# Patient Record
Sex: Male | Born: 1970 | Race: White | Hispanic: No | Smoking: Never smoker
Health system: Southern US, Community
[De-identification: ages and names within clinical notes are randomized; demographics above are authoritative.]

## PROBLEM LIST (undated history)

## (undated) DIAGNOSIS — I10 Essential (primary) hypertension: Secondary | ICD-10-CM

## (undated) DIAGNOSIS — J309 Allergic rhinitis, unspecified: Secondary | ICD-10-CM

## (undated) DIAGNOSIS — Q899 Congenital malformation, unspecified: Secondary | ICD-10-CM

## (undated) DIAGNOSIS — G959 Disease of spinal cord, unspecified: Secondary | ICD-10-CM

## (undated) DIAGNOSIS — G4733 Obstructive sleep apnea (adult) (pediatric): Secondary | ICD-10-CM

## (undated) DIAGNOSIS — F909 Attention-deficit hyperactivity disorder, unspecified type: Secondary | ICD-10-CM

## (undated) DIAGNOSIS — U071 COVID-19: Secondary | ICD-10-CM

## (undated) DIAGNOSIS — R569 Unspecified convulsions: Secondary | ICD-10-CM

## (undated) DIAGNOSIS — S069X9A Unspecified intracranial injury with loss of consciousness of unspecified duration, initial encounter: Secondary | ICD-10-CM

## (undated) DIAGNOSIS — J45909 Unspecified asthma, uncomplicated: Secondary | ICD-10-CM

## (undated) DIAGNOSIS — E119 Type 2 diabetes mellitus without complications: Secondary | ICD-10-CM

## (undated) DIAGNOSIS — M719 Bursopathy, unspecified: Secondary | ICD-10-CM

## (undated) DIAGNOSIS — F4310 Post-traumatic stress disorder, unspecified: Secondary | ICD-10-CM

## (undated) DIAGNOSIS — T07XXXA Unspecified multiple injuries, initial encounter: Secondary | ICD-10-CM

## (undated) DIAGNOSIS — H9319 Tinnitus, unspecified ear: Secondary | ICD-10-CM

## (undated) DIAGNOSIS — W3400XA Accidental discharge from unspecified firearms or gun, initial encounter: Secondary | ICD-10-CM

## (undated) DIAGNOSIS — G43909 Migraine, unspecified, not intractable, without status migrainosus: Secondary | ICD-10-CM

## (undated) DIAGNOSIS — N189 Chronic kidney disease, unspecified: Secondary | ICD-10-CM

## (undated) DIAGNOSIS — Q282 Arteriovenous malformation of cerebral vessels: Secondary | ICD-10-CM

## (undated) DIAGNOSIS — K449 Diaphragmatic hernia without obstruction or gangrene: Secondary | ICD-10-CM

## (undated) DIAGNOSIS — S069XAA Unspecified intracranial injury with loss of consciousness status unknown, initial encounter: Secondary | ICD-10-CM

## (undated) HISTORY — DX: Unspecified asthma, uncomplicated: J45.909

## (undated) HISTORY — DX: Unspecified convulsions: R56.9

## (undated) HISTORY — DX: COVID-19: U07.1

## (undated) HISTORY — DX: Chronic kidney disease, unspecified: N18.9

## (undated) HISTORY — DX: Unspecified intracranial injury with loss of consciousness of unspecified duration, initial encounter: S06.9X9A

## (undated) HISTORY — PX: APPENDECTOMY: SHX54

## (undated) HISTORY — DX: Attention-deficit hyperactivity disorder, unspecified type: F90.9

## (undated) HISTORY — PX: BRAIN AVM REPAIR: SHX202

## (undated) HISTORY — DX: Arteriovenous malformation of cerebral vessels: Q28.2

## (undated) HISTORY — DX: Post-traumatic stress disorder, unspecified: F43.10

## (undated) HISTORY — PX: ACHILLES TENDON SURGERY: SHX542

## (undated) HISTORY — DX: Migraine, unspecified, not intractable, without status migrainosus: G43.909

## (undated) HISTORY — PX: OTHER SURGICAL HISTORY: SHX169

## (undated) HISTORY — DX: Allergic rhinitis, unspecified: J30.9

## (undated) HISTORY — DX: Disease of spinal cord, unspecified: G95.9

## (undated) HISTORY — DX: Obstructive sleep apnea (adult) (pediatric): G47.33

## (undated) HISTORY — DX: Unspecified multiple injuries, initial encounter: T07.XXXA

## (undated) HISTORY — DX: Diaphragmatic hernia without obstruction or gangrene: K44.9

## (undated) HISTORY — DX: Tinnitus, unspecified ear: H93.19

## (undated) HISTORY — DX: Unspecified intracranial injury with loss of consciousness status unknown, initial encounter: S06.9XAA

## (undated) HISTORY — DX: Bursopathy, unspecified: M71.9

## (undated) HISTORY — DX: Congenital malformation, unspecified: Q89.9

---

## 2011-09-25 ENCOUNTER — Emergency Department: Payer: Self-pay | Admitting: Emergency Medicine

## 2011-09-25 LAB — CBC
HCT: 43.9 % (ref 40.0–52.0)
MCH: 27.5 pg (ref 26.0–34.0)
MCV: 81 fL (ref 80–100)
Platelet: 242 10*3/uL (ref 150–440)
RBC: 5.43 10*6/uL (ref 4.40–5.90)
RDW: 13.8 % (ref 11.5–14.5)

## 2011-09-25 LAB — COMPREHENSIVE METABOLIC PANEL
Alkaline Phosphatase: 71 U/L (ref 50–136)
Calcium, Total: 8.9 mg/dL (ref 8.5–10.1)
Co2: 28 mmol/L (ref 21–32)
Creatinine: 0.98 mg/dL (ref 0.60–1.30)
EGFR (Non-African Amer.): >60
SGOT(AST): 52 U/L — ABNORMAL HIGH (ref 15–37)
SGPT (ALT): 82 U/L — ABNORMAL HIGH (ref 12–78)

## 2011-09-25 LAB — ETHANOL
Ethanol %: 0.038 % (ref 0.000–0.080)
Ethanol: 38 mg/dL

## 2011-09-25 LAB — TSH: Thyroid Stimulating Horm: 1.76 u[IU]/mL

## 2011-09-26 LAB — BASIC METABOLIC PANEL
Anion Gap: 7 (ref 7–16)
BUN: 17 mg/dL (ref 7–18)
Calcium, Total: 8.9 mg/dL (ref 8.5–10.1)
Chloride: 107 mmol/L (ref 98–107)
EGFR (Non-African Amer.): >60
Glucose: 103 mg/dL — ABNORMAL HIGH (ref 65–99)
Osmolality: 283 (ref 275–301)
Potassium: 3.9 mmol/L (ref 3.5–5.1)

## 2011-09-26 LAB — CBC
HCT: 43.3 % (ref 40.0–52.0)
MCHC: 33.8 g/dL (ref 32.0–36.0)
Platelet: 233 10*3/uL (ref 150–440)
RBC: 5.37 10*6/uL (ref 4.40–5.90)
WBC: 12.1 10*3/uL — ABNORMAL HIGH (ref 3.8–10.6)

## 2011-09-26 LAB — URINALYSIS, COMPLETE
Bilirubin,UR: NEGATIVE
Nitrite: NEGATIVE
RBC,UR: <1 /HPF (ref 0–5)
Squamous Epithelial: NONE SEEN
WBC UR: <1 /HPF (ref 0–5)

## 2011-09-26 LAB — DRUG SCREEN, URINE
Barbiturates, Ur Screen: NEGATIVE (ref ?–200)
Cannabinoid 50 Ng, Ur ~~LOC~~: NEGATIVE (ref ?–50)
Cocaine Metabolite,Ur ~~LOC~~: NEGATIVE (ref ?–300)
MDMA (Ecstasy)Ur Screen: NEGATIVE (ref ?–500)
Opiate, Ur Screen: POSITIVE (ref ?–300)
Tricyclic, Ur Screen: NEGATIVE (ref ?–1000)

## 2011-09-26 LAB — APTT: Activated PTT: 28.6 secs (ref 23.6–35.9)

## 2013-02-16 ENCOUNTER — Emergency Department: Payer: Self-pay | Admitting: Emergency Medicine

## 2013-02-16 LAB — CBC WITH DIFFERENTIAL/PLATELET
BASOS PCT: 0.8 %
Basophil #: 0.1 10*3/uL (ref 0.0–0.1)
EOS ABS: 0.4 10*3/uL (ref 0.0–0.7)
EOS PCT: 5.1 %
HCT: 45.5 % (ref 40.0–52.0)
HGB: 15.6 g/dL (ref 13.0–18.0)
LYMPHS ABS: 1.2 10*3/uL (ref 1.0–3.6)
LYMPHS PCT: 13.9 %
MCH: 27.7 pg (ref 26.0–34.0)
MCHC: 34.2 g/dL (ref 32.0–36.0)
MCV: 81 fL (ref 80–100)
Monocyte #: 1 x10 3/mm (ref 0.2–1.0)
Monocyte %: 11.7 %
Neutrophil #: 5.7 10*3/uL (ref 1.4–6.5)
Neutrophil %: 68.5 %
Platelet: 190 10*3/uL (ref 150–440)
RBC: 5.62 10*6/uL (ref 4.40–5.90)
RDW: 14.2 % (ref 11.5–14.5)
WBC: 8.4 10*3/uL (ref 3.8–10.6)

## 2013-02-16 LAB — URINALYSIS, COMPLETE
BACTERIA: NONE SEEN
BLOOD: NEGATIVE
Bilirubin,UR: NEGATIVE
Glucose,UR: NEGATIVE mg/dL (ref 0–75)
KETONE: NEGATIVE
LEUKOCYTE ESTERASE: NEGATIVE
Nitrite: NEGATIVE
Ph: 6 (ref 4.5–8.0)
Protein: NEGATIVE
RBC,UR: NONE SEEN /HPF (ref 0–5)
Specific Gravity: 1.005 (ref 1.003–1.030)
Squamous Epithelial: NONE SEEN

## 2013-02-16 LAB — COMPREHENSIVE METABOLIC PANEL
Albumin: 3.7 g/dL (ref 3.4–5.0)
Alkaline Phosphatase: 75 U/L
Anion Gap: 6 — ABNORMAL LOW (ref 7–16)
BUN: 6 mg/dL — ABNORMAL LOW (ref 7–18)
Bilirubin,Total: 0.5 mg/dL (ref 0.2–1.0)
CALCIUM: 8.4 mg/dL — AB (ref 8.5–10.1)
Chloride: 101 mmol/L (ref 98–107)
Co2: 26 mmol/L (ref 21–32)
Creatinine: 0.96 mg/dL (ref 0.60–1.30)
EGFR (Non-African Amer.): >60
GLUCOSE: 140 mg/dL — AB (ref 65–99)
Osmolality: 266 (ref 275–301)
Potassium: 3.9 mmol/L (ref 3.5–5.1)
SGOT(AST): 87 U/L — ABNORMAL HIGH (ref 15–37)
SGPT (ALT): 140 U/L — ABNORMAL HIGH (ref 12–78)
SODIUM: 133 mmol/L — AB (ref 136–145)
TOTAL PROTEIN: 7.5 g/dL (ref 6.4–8.2)

## 2013-02-16 LAB — LIPASE, BLOOD: Lipase: 94 U/L (ref 73–393)

## 2014-04-07 ENCOUNTER — Emergency Department: Payer: Self-pay | Admitting: Physician Assistant
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
## 2014-09-07 ENCOUNTER — Emergency Department
Admission: EM | Admit: 2014-09-07 | Discharge: 2014-09-07 | Disposition: A | Discharge status: Home / Self Care | Payer: Medicare Other | Attending: Emergency Medicine | Admitting: Emergency Medicine

## 2014-09-07 ENCOUNTER — Emergency Department: Payer: Medicare Other

## 2014-09-07 ENCOUNTER — Encounter: Payer: Self-pay | Admitting: Emergency Medicine

## 2014-09-07 ENCOUNTER — Other Ambulatory Visit: Payer: Self-pay

## 2014-09-07 DIAGNOSIS — R059 Cough, unspecified: Secondary | ICD-10-CM

## 2014-09-07 DIAGNOSIS — R05 Cough: Secondary | ICD-10-CM

## 2014-09-07 DIAGNOSIS — B349 Viral infection, unspecified: Secondary | ICD-10-CM | POA: Diagnosis not present

## 2014-09-07 DIAGNOSIS — N23 Unspecified renal colic: Secondary | ICD-10-CM | POA: Insufficient documentation

## 2014-09-07 DIAGNOSIS — R109 Unspecified abdominal pain: Secondary | ICD-10-CM

## 2014-09-07 DIAGNOSIS — E119 Type 2 diabetes mellitus without complications: Secondary | ICD-10-CM | POA: Insufficient documentation

## 2014-09-07 DIAGNOSIS — R1011 Right upper quadrant pain: Secondary | ICD-10-CM | POA: Diagnosis present

## 2014-09-07 HISTORY — DX: Type 2 diabetes mellitus without complications: E11.9

## 2014-09-07 LAB — CBC
HCT: 45.6 % (ref 40.0–52.0)
Hemoglobin: 15.6 g/dL (ref 13.0–18.0)
MCH: 28.3 pg (ref 26.0–34.0)
MCHC: 34.2 g/dL (ref 32.0–36.0)
MCV: 82.9 fL (ref 80.0–100.0)
Platelets: 211 10*3/uL (ref 150–440)
RBC: 5.51 MIL/uL (ref 4.40–5.90)
RDW: 13.5 % (ref 11.5–14.5)
WBC: 6.5 10*3/uL (ref 3.8–10.6)

## 2014-09-07 LAB — HEPATIC FUNCTION PANEL
ALK PHOS: 63 U/L (ref 38–126)
ALT: 81 U/L — ABNORMAL HIGH (ref 17–63)
AST: 93 U/L — ABNORMAL HIGH (ref 15–41)
Albumin: 3.9 g/dL (ref 3.5–5.0)
Bilirubin, Direct: 0.1 mg/dL (ref 0.1–0.5)
Indirect Bilirubin: 0.5 mg/dL (ref 0.3–0.9)
Total Bilirubin: 0.6 mg/dL (ref 0.3–1.2)
Total Protein: 7.2 g/dL (ref 6.5–8.1)

## 2014-09-07 LAB — URINALYSIS COMPLETE WITH MICROSCOPIC (ARMC ONLY)
BACTERIA UA: NONE SEEN
Bilirubin Urine: NEGATIVE
GLUCOSE, UA: 150 mg/dL — AB
Hgb urine dipstick: NEGATIVE
KETONES UR: NEGATIVE mg/dL
Leukocytes, UA: NEGATIVE
Nitrite: NEGATIVE
Protein, ur: NEGATIVE mg/dL
SPECIFIC GRAVITY, URINE: 1.011 (ref 1.005–1.030)
Squamous Epithelial / LPF: NONE SEEN
pH: 7 (ref 5.0–8.0)

## 2014-09-07 LAB — LIPASE, BLOOD: Lipase: 16 U/L — ABNORMAL LOW (ref 22–51)

## 2014-09-07 LAB — BASIC METABOLIC PANEL
Anion gap: 7 (ref 5–15)
BUN: 8 mg/dL (ref 6–20)
CO2: 26 mmol/L (ref 22–32)
CREATININE: 0.94 mg/dL (ref 0.61–1.24)
Calcium: 8.7 mg/dL — ABNORMAL LOW (ref 8.9–10.3)
Chloride: 100 mmol/L — ABNORMAL LOW (ref 101–111)
GFR calc Af Amer: >60 mL/min (ref >60)
GFR calc non Af Amer: >60 mL/min (ref >60)
Glucose, Bld: 274 mg/dL — ABNORMAL HIGH (ref 65–99)
Potassium: 4.2 mmol/L (ref 3.5–5.1)
Sodium: 133 mmol/L — ABNORMAL LOW (ref 135–145)

## 2014-09-07 LAB — TROPONIN I

## 2014-09-07 MED ORDER — OXYCODONE-ACETAMINOPHEN 5-325 MG PO TABS: 1.0000 | ORAL_TABLET | Freq: Four times a day (QID) | ORAL | Status: DC | PRN

## 2014-09-07 MED ORDER — HYDROMORPHONE HCL 1 MG/ML IJ SOLN
1.0000 mg | Freq: Once | INTRAMUSCULAR | Status: AC
  Administered 2014-09-07: 1 mg via INTRAVENOUS
  Filled 2014-09-07: qty 1

## 2014-09-07 MED ORDER — ONDANSETRON HCL 4 MG/2ML IJ SOLN
4.0000 mg | Freq: Once | INTRAMUSCULAR | Status: AC
  Administered 2014-09-07: 4 mg via INTRAVENOUS
  Filled 2014-09-07: qty 2

## 2014-09-07 MED ORDER — SODIUM CHLORIDE 0.9 % IV SOLN
INTRAVENOUS | Status: DC
  Administered 2014-09-07: 17:00:00 via INTRAVENOUS

## 2014-09-07 NOTE — ED Notes (Addendum)
Pt appears to be in severe pain. Pt states after coughing he heard a popping sound before pain. Pain to chest and middle of back that is increased with breathing. Bilateral lung sounds. Wheezing noted to left lung and rhonchi to right. Cold symptoms since Monday. Pt states he is coughing up brown mucus. Currently taking Amoxillin  for swimmer ear infection.

## 2014-09-07 NOTE — ED Notes (Signed)
Reports recent URI and ear infection.  Currently taking amoxicillin.  States coughing this morning and felt a "pop" in front chest, lower ribs.  States has vomited twice since being at the ED.

## 2014-09-07 NOTE — ED Provider Notes (Addendum)
CSN: [id number]     Arrival date & time 09/07/14  1227 History   First MD Initiated Contact with Patient 09/07/14 1615     Chief Complaint  Patient presents with  . Chest Pain  . Cough     (Consider location/radiation/quality/duration/timing/severity/associated sxs/prior Treatment) HPI  44 year old male presents to the emergency department for evaluation of cough, congestion, right upper quadrant abdominal pain, and right flank pain. Patient states he's had cough and congestion with sinus drainage for the last week. Coughing increased over the last 2 days. This morning upon awakening patient had phlegm caught in his throat, he had several deeper part coughs and felt a pop in his right last rib. He states he's had sharp pain in his right upper quadrant of the abdomen. He points to his right upper quadrant as location of pain. He states this will radiate to his back and will sometimes radiate down into his pelvic area. He has a history of kidney stones. He denies any urinary symptoms. He denies any chest pain or shortness of breath. Patient was recently treated 2 days ago with anabiotic's for right ear otitis media. He denies any nausea vomiting. He has a history of appendectomy.  Past Medical History  Diagnosis Date  . Diabetes mellitus without complication    Past Surgical History  Procedure Laterality Date  . Right ankle surgery     No family history on file. History  Substance Use Topics  . Smoking status: Never Smoker   . Smokeless tobacco: Never Used  . Alcohol Use: No    Review of Systems  Constitutional: Negative.  Negative for fever, chills, activity change and appetite change.  HENT: Positive for congestion. Negative for ear pain, mouth sores, rhinorrhea, sinus pressure, sore throat and trouble swallowing.   Eyes: Negative for photophobia, pain and discharge.  Respiratory: Negative for cough, chest tightness and shortness of breath.   Cardiovascular: Negative for chest pain  and leg swelling.  Gastrointestinal: Positive for abdominal pain. Negative for nausea, vomiting, diarrhea and abdominal distention.  Genitourinary: Positive for flank pain. Negative for dysuria and difficulty urinating.  Musculoskeletal: Negative for back pain, arthralgias and gait problem.  Skin: Negative for color change and rash.  Neurological: Negative for dizziness and headaches.  Hematological: Negative for adenopathy.  Psychiatric/Behavioral: Negative for behavioral problems and agitation.      Allergies  Tramadol  Home Medications   Prior to Admission medications   Medication Sig Start Date End Date Taking? Authorizing Provider  oxyCODONE-acetaminophen (ROXICET) 5-325 MG per tablet Take 1-2 tablets by mouth every 6 (six) hours as needed for severe pain. 09/07/14   Evon Slack, PA-C   BP 128/81 mmHg  Pulse 92  Temp(Src) 98.2 F (36.8 C) (Oral)  Resp 18  Ht 5\' 10"  (1.778 m)  Wt 248 lb (112.492 kg)  BMI 35.58 kg/m2  SpO2 100% Physical Exam  Constitutional: He is oriented to person, place, and time. He appears well-developed and well-nourished.  HENT:  Head: Normocephalic and atraumatic.  Eyes: Conjunctivae and EOM are normal. Pupils are equal, round, and reactive to light.  Neck: Normal range of motion. Neck supple.  Cardiovascular: Normal rate, regular rhythm, normal heart sounds and intact distal pulses.  Exam reveals no friction rub.   Pulmonary/Chest: Effort normal and breath sounds normal. No respiratory distress. He has no wheezes. He has no rales. He exhibits no tenderness.  Abdominal: Soft. Bowel sounds are normal. He exhibits no distension and no mass. There is tenderness (  right upper quadrant, positive Murphy's sign. Positive tenderness to percussion with CVA percussion). There is no rebound and no guarding.  Musculoskeletal: Normal range of motion. He exhibits no edema or tenderness.  Neurological: He is alert and oriented to person, place, and time.  Skin:  Skin is warm and dry.  Psychiatric: He has a normal mood and affect. His behavior is normal. Judgment and thought content normal.    ED Course  Procedures (including critical care time) Labs Review Labs Reviewed  BASIC METABOLIC PANEL - Abnormal; Notable for the following:    Sodium 133 (*)    Chloride 100 (*)    Glucose, Bld 274 (*)    Calcium 8.7 (*)    All other components within normal limits  LIPASE, BLOOD - Abnormal; Notable for the following:    Lipase 16 (*)    All other components within normal limits  HEPATIC FUNCTION PANEL - Abnormal; Notable for the following:    AST 93 (*)    ALT 81 (*)    All other components within normal limits  URINALYSIS COMPLETEWITH MICROSCOPIC ([facility] ONLY) - Abnormal; Notable for the following:    Color, Urine YELLOW (*)    APPearance CLEAR (*)    Glucose, UA 150 (*)    All other components within normal limits  CBC  TROPONIN I    Imaging Review Ct Abdomen Pelvis Wo Contrast  09/07/2014   CLINICAL DATA:  Right upper quadrant abdominal/ right flank pain for 12 hr after coughing. Cough for 1 week. Nausea and vomiting.  EXAM: CT ABDOMEN AND PELVIS WITHOUT CONTRAST  TECHNIQUE: Multidetector CT imaging of the abdomen and pelvis was performed following the standard protocol without IV contrast.  COMPARISON:  02/16/2013  FINDINGS: There is at most minimal atelectasis in the lung bases.  Diffusely decreased attenuation of the liver is again noted and is consistent with steatosis with mild fatty sparing in the gallbladder fossa. The gallbladder, spleen, adrenal glands, and pancreas have an unremarkable unenhanced appearance. There is a 2 mm nonobstructing calculus in the interpolar left kidney, unchanged. No right renal calculi or hydronephrosis is seen. No ureteral calculi or ureteral dilatation is identified.  There is no evidence of bowel obstruction. Appendix is not visualized consistent with prior appendectomy. No bowel wall thickening is seen.  There  is moderate distention of the bladder which is otherwise unremarkable. No free fluid or enlarged lymph nodes are identified. No acute osseous abnormality is seen.  IMPRESSION: 1. Punctate, nonobstructing left renal calculus. No right-sided urinary tract calculi identified. 2. Hepatic steatosis.   Electronically Signed   By: Sebastian Ache   On: 09/07/2014 16:58   Dg Chest 2 View  09/07/2014   CLINICAL DATA:  Right-sided chest pain beginning 09/06/2014. Initial encounter.  EXAM: CHEST  2 VIEW  COMPARISON:  PA and lateral chest 04/07/2014.  FINDINGS: Lungs are clear. Heart size is normal. No pneumothorax or pleural effusion. No focal bony abnormality.  IMPRESSION: Negative chest.   Electronically Signed   By: Drusilla Kanner M.D.   On: 09/07/2014 13:44    EKG  Date: 09/07/2014  Rate: 77  Rhythm: normal sinus rhythm  QRS Axis: normal  Intervals: normal  ST/T Wave abnormalities: normal  Conduction Disutrbances: none      MDM   Final diagnoses:  Right flank pain  Renal colic on right side  Cough  Viral illness    44 year old male with right upper quadrant and right flank pain that began earlier this  morning after coughing. Patient denies any chest pain or shortness of breath. Chest x-ray was normal. CT of the abdomen and pelvis was normal except for renal calculi. Labs/area were all normal. Patient and I discussed renal colic, muscular strain. Patient was offered ultrasound to better visualize the gallbladder but they did he would not like to proceed with this imaging today. We also discussed red flags to return to the ER for. Patient was given Percocet for pain. He'll return to the ER immediately for any worsening symptoms or urgent changes in his health.    Evon Slack, PA-C 09/07/14 2005  Jene Every, MD 09/07/14 2032  Evon Slack, PA-C 09/07/14 2356  Jene Every, MD 09/09/14 1340

## 2014-09-07 NOTE — ED Notes (Signed)
Pt alert and oriented X4, active, cooperative, pt in NAD. RR even and unlabored, color WNL.  Pt informed to return if any life threatening symptoms occur.  Pt leaving with cousin who is driving him home.

## 2014-09-07 NOTE — ED Notes (Signed)
Consult ERMD for results review. Pt moved to flex wait.

## 2014-09-07 NOTE — Discharge Instructions (Signed)
Abdominal Pain Many things can cause abdominal pain. Usually, abdominal pain is not caused by a disease and will improve without treatment. It can often be observed and treated at home. Your health care provider will do a physical exam and possibly order blood tests and X-rays to help determine the seriousness of your pain. However, in many cases, more time must pass before a clear cause of the pain can be found. Before that point, your health care provider may not know if you need more testing or further treatment. HOME CARE INSTRUCTIONS  Monitor your abdominal pain for any changes. The following actions may help to alleviate any discomfort you are experiencing:  Only take over-the-counter or prescription medicines as directed by your health care provider.  Do not take laxatives unless directed to do so by your health care provider.  Try a clear liquid diet (broth, tea, or water) as directed by your health care provider. Slowly move to a bland diet as tolerated. SEEK MEDICAL CARE IF:  You have unexplained abdominal pain.  You have abdominal pain associated with nausea or diarrhea.  You have pain when you urinate or have a bowel movement.  You experience abdominal pain that wakes you in the night.  You have abdominal pain that is worsened or improved by eating food.  You have abdominal pain that is worsened with eating fatty foods.  You have a fever. SEEK IMMEDIATE MEDICAL CARE IF:   Your pain does not go away within 2 hours.  You keep throwing up (vomiting).  Your pain is felt only in portions of the abdomen, such as the right side or the left lower portion of the abdomen.  You pass bloody or black tarry stools. MAKE SURE YOU:  Understand these instructions.   Will watch your condition.   Will get help right away if you are not doing well or get worse.  Document Released: 11/11/2004 Document Revised: 02/06/2013 Document Reviewed: 10/11/2012 [facility] Patient Information  2015 [place], [place]. This information is not intended to replace advice given to you by your health care provider. Make sure you discuss any questions you have with your health care provider.   Cough, Adult  A cough is a reflex. It helps you clear your throat and airways. A cough can help heal your body. A cough can last 2 or 3 weeks (acute) or may last more than 8 weeks (chronic). Some common causes of a cough can include an infection, allergy, or a cold. HOME CARE  Only take medicine as told by your doctor.  If given, take your medicines (antibiotics) as told. Finish them even if you start to feel better.  Use a cold steam vaporizer or humidifier in your home. This can help loosen thick spit (secretions).  Sleep so you are almost sitting up (semi-upright). Use pillows to do this. This helps reduce coughing.  Rest as needed.  Stop smoking if you smoke. GET HELP RIGHT AWAY IF:  You have yellowish-white fluid (pus) in your thick spit.  Your cough gets worse.  Your medicine does not reduce coughing, and you are losing sleep.  You cough up blood.  You have trouble breathing.  Your pain gets worse and medicine does not help.  You have a fever. MAKE SURE YOU:   Understand these instructions.  Will watch your condition.  Will get help right away if you are not doing well or get worse. Document Released: 10/15/2010 Document Revised: 06/18/2013 Document Reviewed: 10/15/2010 [employer] Patient Information 2015 [employer]. This information is not intended to replace advice given to you by your health care provider. Make sure you discuss any questions you have with your health care provider.  Flank Pain Flank pain is pain in your side. The flank is the area of your side between your upper belly (abdomen) and your back. Pain in this area can be caused by many different things. HOME CARE Home care and treatment will depend on the cause of your pain.  Rest as told by your  doctor.  Drink enough fluids to keep your pee (urine) clear or pale yellow.  Only take medicine as told by your doctor.  Tell your doctor about any changes in your pain.  Follow up with your doctor. GET HELP RIGHT AWAY IF:   Your pain does not get better with medicine.   You have new symptoms or your symptoms get worse.  Your pain gets worse.   You have belly (abdominal) pain.   You are short of breath.   You always feel sick to your stomach (nauseous).   You keep throwing up (vomiting).   You have puffiness (swelling) in your belly.   You feel light-headed or you pass out (faint).   You have blood in your pee.  You have a fever or lasting symptoms for more than 2-3 days.  You have a fever and your symptoms suddenly get worse. MAKE SURE YOU:   Understand these instructions.  Will watch your condition.  Will get help right away if you are not doing well or get worse. Document Released: 11/11/2007 Document Revised: 06/18/2013 Document Reviewed: 09/16/2011 [facility] Patient Information 2015 [place], [place]. This information is not intended to replace advice given to you by your health care provider. Make sure you discuss any questions you have with your health care provider.

## 2014-09-08 LAB — HM HEPATITIS C SCREENING LAB: HM Hepatitis Screen: NEGATIVE

## 2015-02-07 ENCOUNTER — Emergency Department: Payer: Medicare Other

## 2015-02-07 ENCOUNTER — Encounter: Payer: Self-pay | Admitting: Emergency Medicine

## 2015-02-07 ENCOUNTER — Emergency Department
Admission: EM | Admit: 2015-02-07 | Discharge: 2015-02-07 | Disposition: A | Discharge status: Home / Self Care | Payer: Medicare Other | Attending: Emergency Medicine | Admitting: Emergency Medicine

## 2015-02-07 DIAGNOSIS — G8929 Other chronic pain: Secondary | ICD-10-CM | POA: Insufficient documentation

## 2015-02-07 DIAGNOSIS — E119 Type 2 diabetes mellitus without complications: Secondary | ICD-10-CM | POA: Insufficient documentation

## 2015-02-07 DIAGNOSIS — I10 Essential (primary) hypertension: Secondary | ICD-10-CM | POA: Diagnosis not present

## 2015-02-07 DIAGNOSIS — R0789 Other chest pain: Secondary | ICD-10-CM

## 2015-02-07 DIAGNOSIS — R42 Dizziness and giddiness: Secondary | ICD-10-CM | POA: Insufficient documentation

## 2015-02-07 HISTORY — DX: Accidental discharge from unspecified firearms or gun, initial encounter: W34.00XA

## 2015-02-07 HISTORY — DX: Essential (primary) hypertension: I10

## 2015-02-07 HISTORY — DX: Post-traumatic stress disorder, unspecified: F43.10

## 2015-02-07 LAB — COMPREHENSIVE METABOLIC PANEL
ALT: 96 U/L — ABNORMAL HIGH (ref 17–63)
Albumin: 4.4 g/dL (ref 3.5–5.0)
Alkaline Phosphatase: 66 U/L (ref 38–126)
Anion gap: 10 (ref 5–15)
BUN: 11 mg/dL (ref 6–20)
CHLORIDE: 99 mmol/L — AB (ref 101–111)
CO2: 25 mmol/L (ref 22–32)
Calcium: 9.2 mg/dL (ref 8.9–10.3)
Creatinine, Ser: 0.93 mg/dL (ref 0.61–1.24)
GFR calc Af Amer: >60 mL/min (ref >60)
GFR calc non Af Amer: >60 mL/min (ref >60)
Glucose, Bld: 142 mg/dL — ABNORMAL HIGH (ref 65–99)
SODIUM: 134 mmol/L — AB (ref 135–145)
Total Protein: 7.5 g/dL (ref 6.5–8.1)

## 2015-02-07 LAB — CBC WITH DIFFERENTIAL/PLATELET
BASOS ABS: 0 10*3/uL (ref 0–0.1)
Basophils Relative: 1 %
EOS ABS: 0.2 10*3/uL (ref 0–0.7)
EOS PCT: 2 %
HCT: 46 % (ref 40.0–52.0)
Hemoglobin: 15.8 g/dL (ref 13.0–18.0)
LYMPHS ABS: 1.3 10*3/uL (ref 1.0–3.6)
LYMPHS PCT: 17 %
MCH: 28.5 pg (ref 26.0–34.0)
MCHC: 34.3 g/dL (ref 32.0–36.0)
MCV: 83.3 fL (ref 80.0–100.0)
MONO ABS: 0.7 10*3/uL (ref 0.2–1.0)
Monocytes Relative: 9 %
Neutro Abs: 5.8 10*3/uL (ref 1.4–6.5)
Neutrophils Relative %: 71 %
PLATELETS: 195 10*3/uL (ref 150–440)
RBC: 5.53 MIL/uL (ref 4.40–5.90)
RDW: 13.3 % (ref 11.5–14.5)
WBC: 8.1 10*3/uL (ref 3.8–10.6)

## 2015-02-07 LAB — ETHANOL

## 2015-02-07 LAB — TROPONIN I: Troponin I: 0.04 ng/mL — ABNORMAL HIGH (ref <0.031)

## 2015-02-07 MED ORDER — OXYCODONE-ACETAMINOPHEN 5-325 MG PO TABS
1.0000 | ORAL_TABLET | Freq: Once | ORAL | Status: AC
  Administered 2015-02-07: 1 via ORAL
  Filled 2015-02-07: qty 1

## 2015-02-07 MED ORDER — OXYCODONE-ACETAMINOPHEN 5-325 MG PO TABS: 1.0000 | ORAL_TABLET | Freq: Four times a day (QID) | ORAL | Status: DC | PRN

## 2015-02-07 MED ORDER — FENTANYL CITRATE (PF) 100 MCG/2ML IJ SOLN
50.0000 ug | Freq: Once | INTRAMUSCULAR | Status: AC
  Administered 2015-02-07: 50 ug via INTRAVENOUS
  Filled 2015-02-07: qty 2

## 2015-02-07 NOTE — ED Notes (Signed)
Patient transported to X-ray 

## 2015-02-07 NOTE — Discharge Instructions (Signed)
We are reassured by your exam.  As I explained to you, without further evaluation we cannot definitively tell you that every thing is ok with your heart.  We have low suspicion, however, that your tests would be positive.  Nonetheless, we have offered and advised that you stay for the next few hours to have another blood test done. You have declined this, which is certainly your choice, but it does limit our ability to rule out some more serious problems.  For this reason, we strongly advise return to the emergency room for any new or worrisome symptoms including increased pain in her chest, shortness of breath, change in her chest pain or if you change your mind about further evaluation. It is certainly too given her chest wall may advise follow-up with orthopedic surgery as an outpatient as you could have a torn muscle. In addition, we do routinely recommend that patient's with chest pain follow-up with cardiology and we recommend this for you as well.  Chest Wall Pain Chest wall pain is pain in or around the bones and muscles of your chest. Sometimes, an injury causes this pain. Sometimes, the cause may not be known. This pain may take several weeks or longer to get better. HOME CARE INSTRUCTIONS  Pay attention to any changes in your symptoms. Take these actions to help with your pain:   Rest as told by your health care provider.   Avoid activities that cause pain. These include any activities that use your chest muscles or your abdominal and side muscles to lift heavy items.   If directed, apply ice to the painful area:  Put ice in a plastic bag.  Place a towel between your skin and the bag.  Leave the ice on for 20 minutes, 2-3 times per day.  Take over-the-counter and prescription medicines only as told by your health care provider.  Do not use tobacco products, including cigarettes, chewing tobacco, and e-cigarettes. If you need help quitting, ask your health care provider.  Keep all  follow-up visits as told by your health care provider. This is important. SEEK MEDICAL CARE IF:  You have a fever.  Your chest pain becomes worse.  You have new symptoms. SEEK IMMEDIATE MEDICAL CARE IF:  You have nausea or vomiting.  You feel sweaty or light-headed.  You have a cough with phlegm (sputum) or you cough up blood.  You develop shortness of breath.   This information is not intended to replace advice given to you by your health care provider. Make sure you discuss any questions you have with your health care provider.   Document Released: 02/01/2005 Document Revised: 10/23/2014 Document Reviewed: 04/29/2014 [employer] Interactive Patient Education [employer]
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
## 2015-04-03 ENCOUNTER — Emergency Department: Payer: Medicare Other

## 2015-04-03 ENCOUNTER — Inpatient Hospital Stay
Admission: EM | Admit: 2015-04-03 | Discharge: 2015-04-04 | DRG: 153 | Disposition: A | Discharge status: Home / Self Care | Payer: Medicare Other | Attending: Orthopedic Surgery | Admitting: Orthopedic Surgery

## 2015-04-03 ENCOUNTER — Encounter: Payer: Self-pay | Admitting: Emergency Medicine

## 2015-04-03 DIAGNOSIS — E860 Dehydration: Secondary | ICD-10-CM | POA: Diagnosis present

## 2015-04-03 DIAGNOSIS — I10 Essential (primary) hypertension: Secondary | ICD-10-CM | POA: Diagnosis present

## 2015-04-03 DIAGNOSIS — F4310 Post-traumatic stress disorder, unspecified: Secondary | ICD-10-CM | POA: Diagnosis present

## 2015-04-03 DIAGNOSIS — R740 Nonspecific elevation of levels of transaminase and lactic acid dehydrogenase [LDH]: Secondary | ICD-10-CM

## 2015-04-03 DIAGNOSIS — E871 Hypo-osmolality and hyponatremia: Secondary | ICD-10-CM | POA: Diagnosis present

## 2015-04-03 DIAGNOSIS — K760 Fatty (change of) liver, not elsewhere classified: Secondary | ICD-10-CM | POA: Diagnosis present

## 2015-04-03 DIAGNOSIS — R7989 Other specified abnormal findings of blood chemistry: Secondary | ICD-10-CM | POA: Diagnosis present

## 2015-04-03 DIAGNOSIS — E119 Type 2 diabetes mellitus without complications: Secondary | ICD-10-CM | POA: Diagnosis present

## 2015-04-03 DIAGNOSIS — R7401 Elevation of levels of liver transaminase levels: Secondary | ICD-10-CM

## 2015-04-03 DIAGNOSIS — Z885 Allergy status to narcotic agent status: Secondary | ICD-10-CM | POA: Diagnosis not present

## 2015-04-03 DIAGNOSIS — R1084 Generalized abdominal pain: Secondary | ICD-10-CM

## 2015-04-03 DIAGNOSIS — J111 Influenza due to unidentified influenza virus with other respiratory manifestations: Secondary | ICD-10-CM | POA: Diagnosis present

## 2015-04-03 DIAGNOSIS — A419 Sepsis, unspecified organism: Secondary | ICD-10-CM

## 2015-04-03 DIAGNOSIS — R6510 Systemic inflammatory response syndrome (SIRS) of non-infectious origin without acute organ dysfunction: Secondary | ICD-10-CM

## 2015-04-03 LAB — APTT: APTT: 28 s (ref 24–36)

## 2015-04-03 LAB — COMPREHENSIVE METABOLIC PANEL
ALBUMIN: 4.5 g/dL (ref 3.5–5.0)
ALT: 192 U/L — ABNORMAL HIGH (ref 17–63)
ANION GAP: 11 (ref 5–15)
AST: 545 U/L — AB (ref 15–41)
Alkaline Phosphatase: 73 U/L (ref 38–126)
BUN: 11 mg/dL (ref 6–20)
CHLORIDE: 96 mmol/L — AB (ref 101–111)
CO2: 23 mmol/L (ref 22–32)
Calcium: 9.4 mg/dL (ref 8.9–10.3)
Creatinine, Ser: 0.89 mg/dL (ref 0.61–1.24)
GFR calc Af Amer: >60 mL/min (ref >60)
GLUCOSE: 117 mg/dL — AB (ref 65–99)
POTASSIUM: 4.1 mmol/L (ref 3.5–5.1)
Sodium: 130 mmol/L — ABNORMAL LOW (ref 135–145)
Total Bilirubin: 1.3 mg/dL — ABNORMAL HIGH (ref 0.3–1.2)
Total Protein: 8.1 g/dL (ref 6.5–8.1)

## 2015-04-03 LAB — LIPASE, BLOOD: Lipase: 21 U/L (ref 11–51)

## 2015-04-03 LAB — URINALYSIS COMPLETE WITH MICROSCOPIC (ARMC ONLY)
BACTERIA UA: NONE SEEN
BILIRUBIN URINE: NEGATIVE
GLUCOSE, UA: NEGATIVE mg/dL
HGB URINE DIPSTICK: NEGATIVE
Ketones, ur: NEGATIVE mg/dL
Leukocytes, UA: NEGATIVE
NITRITE: NEGATIVE
Protein, ur: NEGATIVE mg/dL
Specific Gravity, Urine: 1.021 (ref 1.005–1.030)
Squamous Epithelial / LPF: NONE SEEN
pH: 8 (ref 5.0–8.0)

## 2015-04-03 LAB — CBC
HCT: 47.3 % (ref 40.0–52.0)
Hemoglobin: 16.2 g/dL (ref 13.0–18.0)
MCH: 28.4 pg (ref 26.0–34.0)
MCHC: 34.2 g/dL (ref 32.0–36.0)
MCV: 83.1 fL (ref 80.0–100.0)
PLATELETS: 181 10*3/uL (ref 150–440)
RBC: 5.69 MIL/uL (ref 4.40–5.90)
RDW: 13.4 % (ref 11.5–14.5)
WBC: 7.9 10*3/uL (ref 3.8–10.6)

## 2015-04-03 LAB — LACTIC ACID, PLASMA: LACTIC ACID, VENOUS: 1.3 mmol/L (ref 0.5–2.0)

## 2015-04-03 LAB — RAPID INFLUENZA A&B ANTIGENS (ARMC ONLY)
INFLUENZA A (ARMC): POSITIVE
INFLUENZA B (ARMC): NEGATIVE

## 2015-04-03 LAB — PROTIME-INR
INR: 1.08
PROTHROMBIN TIME: 14.2 s (ref 11.4–15.0)

## 2015-04-03 LAB — ACETAMINOPHEN LEVEL: Acetaminophen (Tylenol), Serum: <10 ug/mL — ABNORMAL LOW (ref 10–30)

## 2015-04-03 LAB — TROPONIN I

## 2015-04-03 MED ORDER — MORPHINE SULFATE (PF) 4 MG/ML IV SOLN
4.0000 mg | Freq: Once | INTRAVENOUS | Status: AC
  Administered 2015-04-03: 4 mg via INTRAVENOUS

## 2015-04-03 MED ORDER — IOHEXOL 300 MG/ML  SOLN
125.0000 mL | Freq: Once | INTRAMUSCULAR | Status: AC | PRN
  Administered 2015-04-03: 125 mL via INTRAVENOUS
  Filled 2015-04-03: qty 125

## 2015-04-03 MED ORDER — VANCOMYCIN HCL IN DEXTROSE 1-5 GM/200ML-% IV SOLN
1000.0000 mg | Freq: Once | INTRAVENOUS | Status: AC
  Administered 2015-04-03: 1000 mg via INTRAVENOUS

## 2015-04-03 MED ORDER — VANCOMYCIN HCL IN DEXTROSE 1-5 GM/200ML-% IV SOLN
INTRAVENOUS | Status: AC
  Filled 2015-04-03: qty 200

## 2015-04-03 MED ORDER — SODIUM CHLORIDE 0.9 % IV BOLUS (SEPSIS)
1000.0000 mL | Freq: Once | INTRAVENOUS | Status: AC
  Administered 2015-04-03: 1000 mL via INTRAVENOUS

## 2015-04-03 MED ORDER — ONDANSETRON HCL 4 MG PO TABS: 4.0000 mg | ORAL_TABLET | Freq: Four times a day (QID) | ORAL | Status: DC | PRN

## 2015-04-03 MED ORDER — IBUPROFEN 600 MG PO TABS
600.0000 mg | ORAL_TABLET | Freq: Once | ORAL | Status: AC
  Administered 2015-04-03: 600 mg via ORAL
  Filled 2015-04-03: qty 1

## 2015-04-03 MED ORDER — ENOXAPARIN SODIUM 40 MG/0.4ML ~~LOC~~ SOLN: 40.0000 mg | Freq: Every day | SUBCUTANEOUS | Status: DC

## 2015-04-03 MED ORDER — MORPHINE SULFATE (PF) 2 MG/ML IV SOLN
2.0000 mg | INTRAVENOUS | Status: DC | PRN
  Administered 2015-04-04 (×2): 2 mg via INTRAVENOUS
  Filled 2015-04-03 (×2): qty 1

## 2015-04-03 MED ORDER — SODIUM CHLORIDE 0.9 % IV SOLN
INTRAVENOUS | Status: DC
  Administered 2015-04-03: 22:00:00 via INTRAVENOUS

## 2015-04-03 MED ORDER — ONDANSETRON HCL 4 MG/2ML IJ SOLN
INTRAMUSCULAR | Status: AC
  Filled 2015-04-03: qty 2

## 2015-04-03 MED ORDER — ONDANSETRON HCL 4 MG/2ML IJ SOLN
4.0000 mg | Freq: Four times a day (QID) | INTRAMUSCULAR | Status: DC | PRN
  Administered 2015-04-04: 4 mg via INTRAVENOUS
  Filled 2015-04-03: qty 2

## 2015-04-03 MED ORDER — SODIUM CHLORIDE 0.9% FLUSH
3.0000 mL | Freq: Two times a day (BID) | INTRAVENOUS | Status: DC
  Administered 2015-04-04: 3 mL via INTRAVENOUS

## 2015-04-03 MED ORDER — SODIUM CHLORIDE 0.9 % IV SOLN
INTRAVENOUS | Status: DC
  Administered 2015-04-04 (×2): via INTRAVENOUS

## 2015-04-03 MED ORDER — OSELTAMIVIR PHOSPHATE 75 MG PO CAPS
75.0000 mg | ORAL_CAPSULE | Freq: Once | ORAL | Status: AC
  Administered 2015-04-04: 75 mg via ORAL
  Filled 2015-04-03: qty 1

## 2015-04-03 MED ORDER — MORPHINE SULFATE (PF) 4 MG/ML IV SOLN
INTRAVENOUS | Status: AC
  Filled 2015-04-03: qty 1

## 2015-04-03 MED ORDER — PIPERACILLIN-TAZOBACTAM 3.375 G IVPB
3.3750 g | Freq: Once | INTRAVENOUS | Status: AC
  Administered 2015-04-03: 3.375 g via INTRAVENOUS
  Filled 2015-04-03: qty 50

## 2015-04-03 MED ORDER — IBUPROFEN 400 MG PO TABS
400.0000 mg | ORAL_TABLET | Freq: Four times a day (QID) | ORAL | Status: DC | PRN
  Administered 2015-04-04: 400 mg via ORAL
  Filled 2015-04-03: qty 1

## 2015-04-03 MED ORDER — IOHEXOL 240 MG/ML SOLN
25.0000 mL | Freq: Once | INTRAMUSCULAR | Status: AC | PRN
  Administered 2015-04-03: 25 mL via ORAL
  Filled 2015-04-03: qty 25

## 2015-04-03 MED ORDER — ONDANSETRON HCL 4 MG/2ML IJ SOLN
4.0000 mg | Freq: Once | INTRAMUSCULAR | Status: AC
  Administered 2015-04-03: 4 mg via INTRAVENOUS

## 2015-04-03 MED ORDER — OSELTAMIVIR PHOSPHATE 75 MG PO CAPS
75.0000 mg | ORAL_CAPSULE | Freq: Two times a day (BID) | ORAL | Status: DC
  Administered 2015-04-04: 75 mg via ORAL
  Filled 2015-04-03 (×3): qty 1

## 2015-04-03 NOTE — ED Notes (Signed)
Pt unable to void 

## 2015-04-03 NOTE — ED Notes (Signed)
Pt arrived to the ED via EMS from home for complaints of cough, chest pain and fever for 3 days. Pt states that he has not taken any medication for it. Pt is AOx4 in mild pain distress.
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
## 2015-04-03 NOTE — ED Notes (Signed)
Pt reports chest pain and lower back pain.  Hx of kidney stones.  Pt reports vomiting x 3 this am.  Fever tonight.  Pt also has a dry cough.  Nonsmoker. No sob. Pt alert.  Iv infusing.

## 2015-04-03 NOTE — ED Notes (Signed)
Pt moved to room 4.  Report off to [place] rn.

## 2015-04-03 NOTE — ED Notes (Signed)
Patient transported to CT 

## 2015-04-04 ENCOUNTER — Inpatient Hospital Stay: Payer: Medicare Other

## 2015-04-04 LAB — COMPREHENSIVE METABOLIC PANEL
ALBUMIN: 3.9 g/dL (ref 3.5–5.0)
ALT: 171 U/L — AB (ref 17–63)
AST: 435 U/L — AB (ref 15–41)
Alkaline Phosphatase: 59 U/L (ref 38–126)
Anion gap: 10 (ref 5–15)
BUN: 11 mg/dL (ref 6–20)
CHLORIDE: 101 mmol/L (ref 101–111)
CO2: 22 mmol/L (ref 22–32)
Calcium: 8.3 mg/dL — ABNORMAL LOW (ref 8.9–10.3)
Creatinine, Ser: 1.02 mg/dL (ref 0.61–1.24)
GFR calc Af Amer: >60 mL/min (ref >60)
GFR calc non Af Amer: >60 mL/min (ref >60)
GLUCOSE: 112 mg/dL — AB (ref 65–99)
POTASSIUM: 4.3 mmol/L (ref 3.5–5.1)
SODIUM: 133 mmol/L — AB (ref 135–145)
Total Bilirubin: 1.1 mg/dL (ref 0.3–1.2)
Total Protein: 7.2 g/dL (ref 6.5–8.1)

## 2015-04-04 LAB — CBC WITH DIFFERENTIAL/PLATELET
BASOS ABS: 0 10*3/uL (ref 0–0.1)
BASOS PCT: 0 %
EOS PCT: 1 %
Eosinophils Absolute: 0.1 10*3/uL (ref 0–0.7)
HCT: 41.4 % (ref 40.0–52.0)
Hemoglobin: 14.4 g/dL (ref 13.0–18.0)
LYMPHS PCT: 5 %
Lymphs Abs: 0.3 10*3/uL — ABNORMAL LOW (ref 1.0–3.6)
MCH: 28.5 pg (ref 26.0–34.0)
MCHC: 34.7 g/dL (ref 32.0–36.0)
MCV: 82.3 fL (ref 80.0–100.0)
MONO ABS: 0.6 10*3/uL (ref 0.2–1.0)
Monocytes Relative: 9 %
NEUTROS ABS: 5.7 10*3/uL (ref 1.4–6.5)
NEUTROS PCT: 85 %
PLATELETS: 156 10*3/uL (ref 150–440)
RBC: 5.03 MIL/uL (ref 4.40–5.90)
RDW: 13.4 % (ref 11.5–14.5)
WBC: 6.7 10*3/uL (ref 3.8–10.6)

## 2015-04-04 LAB — PROTIME-INR
INR: 1.15
Prothrombin Time: 14.9 seconds (ref 11.4–15.0)

## 2015-04-04 LAB — PROCALCITONIN: Procalcitonin: 0.53 ng/mL

## 2015-04-04 LAB — APTT: APTT: 30 s (ref 24–36)

## 2015-04-04 LAB — LACTIC ACID, PLASMA: Lactic Acid, Venous: 0.9 mmol/L (ref 0.5–2.0)

## 2015-04-04 MED ORDER — GUAIFENESIN ER 600 MG PO TB12
600.0000 mg | ORAL_TABLET | Freq: Two times a day (BID) | ORAL | Status: DC
  Administered 2015-04-04: 600 mg via ORAL
  Filled 2015-04-04: qty 1

## 2015-04-04 MED ORDER — HYDROMORPHONE HCL 1 MG/ML IJ SOLN
1.0000 mg | INTRAMUSCULAR | Status: DC | PRN
  Administered 2015-04-04: 1 mg via INTRAVENOUS
  Filled 2015-04-04 (×2): qty 1

## 2015-04-04 MED ORDER — OXYCODONE HCL 5 MG PO TABS
5.0000 mg | ORAL_TABLET | ORAL | Status: DC | PRN
  Filled 2015-04-04: qty 1

## 2015-04-04 MED ORDER — SODIUM CHLORIDE 0.9 % IV BOLUS (SEPSIS): 1000.0000 mL | INTRAVENOUS | Status: DC

## 2015-04-04 MED ORDER — OSELTAMIVIR PHOSPHATE 75 MG PO CAPS: 75.0000 mg | ORAL_CAPSULE | Freq: Two times a day (BID) | ORAL | Status: DC

## 2015-04-04 MED ORDER — LISINOPRIL 20 MG PO TABS: 40.0000 mg | ORAL_TABLET | Freq: Every day | ORAL | Status: DC

## 2015-04-04 MED ORDER — PIPERACILLIN-TAZOBACTAM 3.375 G IVPB
3.3750 g | Freq: Three times a day (TID) | INTRAVENOUS | Status: DC
  Administered 2015-04-04: 3.375 g via INTRAVENOUS
  Filled 2015-04-04 (×3): qty 50

## 2015-04-04 MED ORDER — SODIUM CHLORIDE 0.9 % IV BOLUS (SEPSIS): 500.0000 mL | INTRAVENOUS | Status: DC

## 2015-04-04 MED ORDER — GUAIFENESIN ER 600 MG PO TB12: 600.0000 mg | ORAL_TABLET | Freq: Two times a day (BID) | ORAL | Status: DC | PRN

## 2015-04-04 MED ORDER — GUAIFENESIN 100 MG/5ML PO SOLN
5.0000 mL | ORAL | Status: DC | PRN
  Administered 2015-04-04: 100 mg via ORAL
  Filled 2015-04-04: qty 10

## 2015-04-04 MED ORDER — LISINOPRIL 20 MG PO TABS
40.0000 mg | ORAL_TABLET | Freq: Every day | ORAL | Status: DC
  Administered 2015-04-04: 40 mg via ORAL
  Filled 2015-04-04: qty 2

## 2015-04-04 MED ORDER — ONDANSETRON HCL 4 MG PO TABS: 4.0000 mg | ORAL_TABLET | Freq: Four times a day (QID) | ORAL | Status: DC | PRN

## 2015-04-04 MED ORDER — OXYCODONE HCL 5 MG PO TABS: 5.0000 mg | ORAL_TABLET | ORAL | Status: DC | PRN

## 2015-04-04 NOTE — Progress Notes (Signed)
Patient becoming agitated about discharge. Patient reports he's having an allergic reaction: "my throat is closing up." Patient able to speak in full sentences without distress, SpO2 99% on room air at rest. Patient reports having an overdose, this RN administered  IV dilaudid at 0941 and patient declined all other offers of pain medication today despite reporting 8/10 pain. Patient again, able to speak in clear and complete sentences, A&Ox4, no slurred speech, PERRLA, VSS (excepting T of 101 as expected with diagnosed flu). Patient educated on s/s of the flu and provided with flu education via discharge papers, as well as flu medication information.  After discussion of discharge with charge nurse, unit director, and attending, patient stated he would go home. A second opinion was received from Dr. Nemiah Commander who reviewed patients chart and reported no concerns with discharge. Security was brought to bedside during these discussions, as patient was appearing to grow more agitated and volatile. Patient did not raise voice or make threats, security presence was used as a precaution.   IV and TELE removed per policy. Discharge medications reviewed with patient and prescriptions sent home with him. Patient refused to sign discharge instructions, director witnessed. Patient declined multiple offers for taxi and refused wheelchair at discharge as well. Unit director walked with patient to visitors entrance and witness patient leave the campus.
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
## 2015-04-04 NOTE — Progress Notes (Addendum)
Patient flu positive, no other signs of infection discontinued all antibiotics

## 2015-04-04 NOTE — ED Notes (Signed)
Pt c/o pain, RN informed pt next morphine could be given at 0030. Pt understands

## 2015-04-04 NOTE — Progress Notes (Signed)
Pt oriented to unit and room. Tele box connected and verified with [place], [place]. Skin checked with Eden Lathe, RN. No further complaints at this time. Continue to monitor
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
## 2015-04-04 NOTE — Progress Notes (Signed)
Pharmacy Antibiotic Note  Lucas Blair is a 45 y.o. male admitted on 04/03/2015 with IAI.  Pharmacy has been consulted for Zosyn dosing.  Plan: Zosyn 3.375 gm IV Q8H EI.   Height:  (180.3 cm) Weight: 235 lb (106.595 kg) IBW/kg (Calculated) : 75.3  Temp (24hrs), Avg:101 F (38.3 C), Min:98.3 F (36.8 C), Max:103.2 F (39.6 C)   Recent Labs Lab 04/03/15 1956 04/03/15 2116  WBC 7.9  --   CREATININE 0.89  --   LATICACIDVEN  --  1.3    Estimated Creatinine Clearance: 131.5 mL/min (by C-G formula based on Cr of 0.89).    Allergies  Allergen Reactions  . Toradol [Ketorolac Tromethamine]   . Tramadol Other (See Comments)    Seizure     Thank you for allowing pharmacy to be a part of this patient's care.  Carola Frost, Pharm.D., BCPS Clinical Pharmacist 04/04/2015 1:25 AM

## 2015-04-08 LAB — CULTURE, BLOOD (ROUTINE X 2)
Culture: NO GROWTH
Culture: NO GROWTH

## 2016-03-05 ENCOUNTER — Emergency Department
Admission: EM | Admit: 2016-03-05 | Discharge: 2016-03-05 | Disposition: A | Discharge status: Home / Self Care | Payer: Medicare Other | Attending: Emergency Medicine | Admitting: Emergency Medicine

## 2016-03-05 DIAGNOSIS — R443 Hallucinations, unspecified: Secondary | ICD-10-CM | POA: Diagnosis present

## 2016-03-05 DIAGNOSIS — I10 Essential (primary) hypertension: Secondary | ICD-10-CM | POA: Insufficient documentation

## 2016-03-05 DIAGNOSIS — Z79899 Other long term (current) drug therapy: Secondary | ICD-10-CM | POA: Insufficient documentation

## 2016-03-05 DIAGNOSIS — F10920 Alcohol use, unspecified with intoxication, uncomplicated: Secondary | ICD-10-CM

## 2016-03-05 DIAGNOSIS — F10120 Alcohol abuse with intoxication, uncomplicated: Secondary | ICD-10-CM | POA: Insufficient documentation

## 2016-03-05 DIAGNOSIS — E119 Type 2 diabetes mellitus without complications: Secondary | ICD-10-CM | POA: Insufficient documentation

## 2016-03-05 LAB — CBC
HCT: 45 % (ref 40.0–52.0)
HEMOGLOBIN: 15.6 g/dL (ref 13.0–18.0)
MCH: 28.9 pg (ref 26.0–34.0)
MCHC: 34.7 g/dL (ref 32.0–36.0)
MCV: 83.4 fL (ref 80.0–100.0)
Platelets: 218 10*3/uL (ref 150–440)
RBC: 5.39 MIL/uL (ref 4.40–5.90)
RDW: 13.6 % (ref 11.5–14.5)
WBC: 9.6 10*3/uL (ref 3.8–10.6)

## 2016-03-05 LAB — COMPREHENSIVE METABOLIC PANEL
ALT: 55 U/L (ref 17–63)
AST: 52 U/L — AB (ref 15–41)
Albumin: 4.9 g/dL (ref 3.5–5.0)
Alkaline Phosphatase: 49 U/L (ref 38–126)
Anion gap: 9 (ref 5–15)
BUN: 15 mg/dL (ref 6–20)
CHLORIDE: 105 mmol/L (ref 101–111)
CO2: 22 mmol/L (ref 22–32)
CREATININE: 1.12 mg/dL (ref 0.61–1.24)
Calcium: 9.1 mg/dL (ref 8.9–10.3)
GFR calc non Af Amer: >60 mL/min (ref >60)
Glucose, Bld: 104 mg/dL — ABNORMAL HIGH (ref 65–99)
POTASSIUM: 4.2 mmol/L (ref 3.5–5.1)
SODIUM: 136 mmol/L (ref 135–145)
Total Bilirubin: 0.4 mg/dL (ref 0.3–1.2)
Total Protein: 8.5 g/dL — ABNORMAL HIGH (ref 6.5–8.1)

## 2016-03-05 LAB — URINE DRUG SCREEN, QUALITATIVE (ARMC ONLY)
AMPHETAMINES, UR SCREEN: NOT DETECTED
BARBITURATES, UR SCREEN: NOT DETECTED
BENZODIAZEPINE, UR SCRN: POSITIVE — AB
Cannabinoid 50 Ng, Ur ~~LOC~~: NOT DETECTED
Cocaine Metabolite,Ur ~~LOC~~: NOT DETECTED
MDMA (ECSTASY) UR SCREEN: NOT DETECTED
METHADONE SCREEN, URINE: NOT DETECTED
OPIATE, UR SCREEN: NOT DETECTED
PHENCYCLIDINE (PCP) UR S: NOT DETECTED
TRICYCLIC, UR SCREEN: NOT DETECTED

## 2016-03-05 LAB — ACETAMINOPHEN LEVEL: Acetaminophen (Tylenol), Serum: <10 ug/mL — ABNORMAL LOW (ref 10–30)

## 2016-03-05 LAB — ETHANOL: Alcohol, Ethyl (B): 318 mg/dL (ref <5)

## 2016-03-05 LAB — SALICYLATE LEVEL

## 2016-03-05 MED ORDER — LISINOPRIL 20 MG PO TABS
40.0000 mg | ORAL_TABLET | Freq: Once | ORAL | Status: AC
  Administered 2016-03-05: 40 mg via ORAL
  Filled 2016-03-05: qty 2

## 2016-03-05 NOTE — ED Notes (Signed)
Pt given breakfast tray with sprite. Pt was tearful.
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
## 2016-03-05 NOTE — Discharge Instructions (Signed)
Continue taking your lisinopril.   Please avoid getting drinks from other people.  See a psychiatrist  Return to ER if you have persistent hallucinations, thoughts to harm yourself or others, unable to sleep, vomiting.       Substance Abuse Treatment Programs  Intensive Outpatient Programs [facility]     [address]      [place], [place]                   [phone]       [facility] [address] [place], [place] [phone]  [facility]     (Inpatient and outpatient)     [address]           [phone]    [facility] [phone] (Suboxone and Methadone)  [address]      [place], [place] [address]      [phone]       [address] [place], [place] [phone]  [facility] (Outpatient/Inpatient, Chemical)    (insurance only) [phone]             [facility] (Groups & Residential) [place], [place] [phone]     [facility]     [address]     [place], [place]      [phone]       [facility] (for caregivers and family) [phone] [address] [place], [place] [phone]      Residential Treatment Programs [facility]      [address], [place], [place] [address]  [phone]       [facility] [address] [place], [place] [address] [phone]  [facility]        [phone]       [facility] [phone]  [facility] ([facility]             [address]                                         [place], [place]                                                [phone] or [phone]                               [facility] [place] [address] [place], [phone]  [facility]    [address]      [place], [place]     [phone]       [facility] [address] [place],  [place] [phone]  [facility]   [address]     [place], [place] [address]     [phone]      Admissions: 8am-3pm M-F  [facility] (RT[facility]) [address] [place], [place] [phone]  [facility]: Residential Program [phone](90 Days)   [place], [place]      [phone] or [phone]     [facility]C: [facility] [place], [place] (Walk in Hours over the weekend or by referral)  [facility]  [facility] [address] [place], [place], [place] [address] [phone]  Crisis Mobile: [facility]:  [phone] (for crisis response 24 hours a day) [facility] Hotline:      [phone] Outpatient Psychiatry and Counseling  [facility]: Mobile Crisis Management 24 hours:  [phone]  [facility] of the [employer] sliding scale fee and walk in schedule: M-F 8am-12pm/1pm-3pm [address]  [place], [place] [address] [phone]  [facility] [address] [place], [place] [address] [phone]  [facility] (Former[facility] as [facility] [employer])- new patient walk-in appointments available Monday - Friday 8am -3pm.          [address] [place], [place] [address] [phone] or crisis line- [phone]  [facility] Out[facility] Services/ Intensive Outpatient Therapy Program [address] [place], [place] [address] [phone]  [facility]                  Crisis Services      [phone] [address]     [place], [place] [address]                 [place] [facility]   [facility] [phone]. [address] [place], [place] [address]   [employer] of Care          [address]  [place], [place] [address]       [phone]  [facility] [address] [place], [place] [address] [phone]  [facility]    [address]    [place], [place] [address]     [phone]       Andee Poles, MD     [address]     [place] [address]     [phone]       [facility] [address] [place] [address]  [facility]     [address] [place], [place]      [phone]       [facility] Eulogio Ditch, MD [address] [place], [place] [address] [phone]  [facility]     [address]     [place], [place] [address]     [phone]       [facility] [address] [place], [place] [address] [phone] Resources for Temporary Residential Assistance/Crisis Centers  [facility]) M-F 8am-3pm   [address] [place], [place] [address]   [phone] Services include: laundry, barbering, support groups, case management, phone  & computer access, showers, AA/NA mtgs, mental health/substance abuse nurse, job skills class, disability information, VA assistance, spiritual classes, etc.   HOMELESS SHELTERS  [facility]     [facility]   [address], [place]     [phone]              [employer] (women and children)       [address] [place], [place] [address] [phone] [email or web address] for application and process Application Required  [facility]   [address]    [place] [address]     [phone]                    [facility] [place]  [address] [place], [place] [address] [phone] application appt.) Application Required  [facility] (women only)    [address]     [place], [place] [address]     [phone]      Intake starts 6pm daily Need valid ID, SSC, & Police report [employer] [address] [place], [place] [phone] Application  Required  [employer] (men only)     [address]      [place], [place]     [phone]       [facility] [place] (Pregnant women only) [address] [place], [place] [phone]  [facility]      [address]      [place], [place] [address]     [phone]             [facility] [address] [place], [place] [phone] 90 day commitment/SA/Application process  [facility](men only)     [address]     [place], [place]     [phone]       Check-in at [facility] [address] [place], [place] [address] [phone] Men/Women/Women and Children must be there by 7 pm  [facility] [place], [place] [phone]
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
## 2016-03-05 NOTE — ED Notes (Signed)
Pt dressed out in [employer] behavioral scrubs by this RN. Pt's belongings include: 1 pr of rubber boots, 2 pr of socks, 1 pr of blue jeans, 1 leather belt, 1 pr plaid boxers, 1 short sleeve T-shirt, 1 long sleeve shirt, 1 set of keys, and 1 cell phone. Belongings bagged/labeled and placed in BHU locker room.

## 2016-03-05 NOTE — ED Notes (Signed)
Pt woke up due to having episode of incontinence. Pt's sheets changed. Upon waking pt is alert and oriented and shows signs of embarrassment for the situation.
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
## 2016-06-24 LAB — HM HIV SCREENING LAB: HM HIV Screening: NEGATIVE

## 2017-06-02 ENCOUNTER — Emergency Department
Admission: EM | Admit: 2017-06-02 | Discharge: 2017-06-03 | Disposition: A | Discharge status: Home / Self Care | Payer: Medicare Other | Attending: Student in an Organized Health Care Education/Training Program | Admitting: Student in an Organized Health Care Education/Training Program

## 2017-06-02 ENCOUNTER — Emergency Department: Payer: Medicare Other

## 2017-06-02 ENCOUNTER — Other Ambulatory Visit: Payer: Self-pay

## 2017-06-02 DIAGNOSIS — M25572 Pain in left ankle and joints of left foot: Secondary | ICD-10-CM | POA: Diagnosis not present

## 2017-06-02 DIAGNOSIS — M79641 Pain in right hand: Secondary | ICD-10-CM | POA: Insufficient documentation

## 2017-06-02 MED ORDER — HYDROCODONE-ACETAMINOPHEN 5-325 MG PO TABS
1.0000 | ORAL_TABLET | Freq: Once | ORAL | Status: AC
  Administered 2017-06-02: 1 via ORAL
  Filled 2017-06-02: qty 1
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
## 2017-06-02 NOTE — ED Notes (Signed)
ED Provider at bedside. 

## 2017-06-02 NOTE — ED Notes (Signed)
sheriff department remains at bedside
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
## 2017-06-03 MED ORDER — HYDROCODONE-ACETAMINOPHEN 5-325 MG PO TABS: 1.0000 | ORAL_TABLET | ORAL | 0 refills | Status: DC | PRN

## 2017-11-04 ENCOUNTER — Ambulatory Visit: Payer: Medicare Other | Admitting: Internal Medicine

## 2017-11-04 DIAGNOSIS — Z0289 Encounter for other administrative examinations: Secondary | ICD-10-CM

## 2018-07-08 ENCOUNTER — Other Ambulatory Visit
Admission: EM | Admit: 2018-07-08 | Discharge: 2018-07-08 | Disposition: A | Discharge status: Home / Self Care | Payer: Medicare Other | Attending: Family Medicine | Admitting: Family Medicine

## 2018-07-08 NOTE — ED Notes (Signed)
Specimens x 2 obtained from left antecub after area cleansed with betadine.  Patient tolerated well.  Specimens given to [employer].

## 2018-07-08 NOTE — ED Notes (Signed)
Patient here with [employer]	[place] for foensic blood draw with a search warrant.  Search warrant verified by this RN and L Pharmacologist.

## 2018-09-12 ENCOUNTER — Other Ambulatory Visit: Payer: Self-pay

## 2018-09-12 ENCOUNTER — Encounter: Payer: Self-pay | Admitting: Internal Medicine

## 2018-09-12 ENCOUNTER — Ambulatory Visit (INDEPENDENT_AMBULATORY_CARE_PROVIDER_SITE_OTHER): Payer: Medicare Other | Admitting: Internal Medicine

## 2018-09-12 DIAGNOSIS — K760 Fatty (change of) liver, not elsewhere classified: Secondary | ICD-10-CM

## 2018-09-12 DIAGNOSIS — Z20822 Contact with and (suspected) exposure to covid-19: Secondary | ICD-10-CM

## 2018-09-12 DIAGNOSIS — G8929 Other chronic pain: Secondary | ICD-10-CM

## 2018-09-12 DIAGNOSIS — Z1329 Encounter for screening for other suspected endocrine disorder: Secondary | ICD-10-CM

## 2018-09-12 DIAGNOSIS — S069X9S Unspecified intracranial injury with loss of consciousness of unspecified duration, sequela: Secondary | ICD-10-CM

## 2018-09-12 DIAGNOSIS — G43909 Migraine, unspecified, not intractable, without status migrainosus: Secondary | ICD-10-CM

## 2018-09-12 DIAGNOSIS — J45909 Unspecified asthma, uncomplicated: Secondary | ICD-10-CM

## 2018-09-12 DIAGNOSIS — R739 Hyperglycemia, unspecified: Secondary | ICD-10-CM | POA: Diagnosis not present

## 2018-09-12 DIAGNOSIS — Q282 Arteriovenous malformation of cerebral vessels: Secondary | ICD-10-CM

## 2018-09-12 DIAGNOSIS — Z1389 Encounter for screening for other disorder: Secondary | ICD-10-CM

## 2018-09-12 DIAGNOSIS — F4310 Post-traumatic stress disorder, unspecified: Secondary | ICD-10-CM

## 2018-09-12 DIAGNOSIS — K449 Diaphragmatic hernia without obstruction or gangrene: Secondary | ICD-10-CM

## 2018-09-12 DIAGNOSIS — Z0184 Encounter for antibody response examination: Secondary | ICD-10-CM

## 2018-09-12 DIAGNOSIS — G959 Disease of spinal cord, unspecified: Secondary | ICD-10-CM

## 2018-09-12 DIAGNOSIS — I10 Essential (primary) hypertension: Secondary | ICD-10-CM | POA: Diagnosis not present

## 2018-09-12 DIAGNOSIS — E559 Vitamin D deficiency, unspecified: Secondary | ICD-10-CM

## 2018-09-12 DIAGNOSIS — F909 Attention-deficit hyperactivity disorder, unspecified type: Secondary | ICD-10-CM

## 2018-09-12 DIAGNOSIS — J309 Allergic rhinitis, unspecified: Secondary | ICD-10-CM

## 2018-09-12 DIAGNOSIS — Z87898 Personal history of other specified conditions: Secondary | ICD-10-CM

## 2018-09-12 DIAGNOSIS — Z1159 Encounter for screening for other viral diseases: Secondary | ICD-10-CM

## 2018-09-12 DIAGNOSIS — Z20828 Contact with and (suspected) exposure to other viral communicable diseases: Secondary | ICD-10-CM

## 2018-09-12 DIAGNOSIS — G4733 Obstructive sleep apnea (adult) (pediatric): Secondary | ICD-10-CM

## 2018-09-12 DIAGNOSIS — T07XXXA Unspecified multiple injuries, initial encounter: Secondary | ICD-10-CM

## 2018-09-12 MED ORDER — CYCLOBENZAPRINE HCL 10 MG PO TABS: 10.0000 mg | ORAL_TABLET | Freq: Every day | ORAL | 5 refills | Status: AC | PRN

## 2018-09-12 MED ORDER — IBUPROFEN 800 MG PO TABS: 800.0000 mg | ORAL_TABLET | Freq: Three times a day (TID) | ORAL | 0 refills | Status: DC | PRN

## 2018-09-12 MED ORDER — LISINOPRIL 40 MG PO TABS: 40.0000 mg | ORAL_TABLET | Freq: Every day | ORAL | 3 refills | Status: DC
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
## 2018-09-14 ENCOUNTER — Telehealth: Payer: Self-pay | Admitting: *Deleted

## 2018-09-14 NOTE — Telephone Encounter (Signed)
Please place future orders for lab appt.   PCP NEEDS TO BE UPDATED ON CHART ALSO

## 2018-09-15 NOTE — Telephone Encounter (Signed)
Second request. Please place future lab orders for appt on Monday

## 2018-09-18 ENCOUNTER — Telehealth: Payer: Self-pay | Admitting: Internal Medicine

## 2018-09-18 ENCOUNTER — Other Ambulatory Visit (INDEPENDENT_AMBULATORY_CARE_PROVIDER_SITE_OTHER): Payer: Medicare Other

## 2018-09-18 ENCOUNTER — Other Ambulatory Visit: Payer: Self-pay

## 2018-09-18 ENCOUNTER — Encounter: Payer: Self-pay | Admitting: Internal Medicine

## 2018-09-18 ENCOUNTER — Ambulatory Visit: Payer: Medicare Other

## 2018-09-18 VITALS — BP 148/90 | HR 67

## 2018-09-18 DIAGNOSIS — Z1329 Encounter for screening for other suspected endocrine disorder: Secondary | ICD-10-CM | POA: Diagnosis not present

## 2018-09-18 DIAGNOSIS — I10 Essential (primary) hypertension: Secondary | ICD-10-CM | POA: Diagnosis not present

## 2018-09-18 DIAGNOSIS — K760 Fatty (change of) liver, not elsewhere classified: Secondary | ICD-10-CM | POA: Insufficient documentation

## 2018-09-18 DIAGNOSIS — S069XAA Unspecified intracranial injury with loss of consciousness status unknown, initial encounter: Secondary | ICD-10-CM | POA: Insufficient documentation

## 2018-09-18 DIAGNOSIS — Z1389 Encounter for screening for other disorder: Secondary | ICD-10-CM

## 2018-09-18 DIAGNOSIS — T07XXXA Unspecified multiple injuries, initial encounter: Secondary | ICD-10-CM | POA: Insufficient documentation

## 2018-09-18 DIAGNOSIS — F4310 Post-traumatic stress disorder, unspecified: Secondary | ICD-10-CM | POA: Insufficient documentation

## 2018-09-18 DIAGNOSIS — Z87898 Personal history of other specified conditions: Secondary | ICD-10-CM | POA: Insufficient documentation

## 2018-09-18 DIAGNOSIS — S069X9A Unspecified intracranial injury with loss of consciousness of unspecified duration, initial encounter: Secondary | ICD-10-CM | POA: Insufficient documentation

## 2018-09-18 DIAGNOSIS — E559 Vitamin D deficiency, unspecified: Secondary | ICD-10-CM

## 2018-09-18 DIAGNOSIS — K449 Diaphragmatic hernia without obstruction or gangrene: Secondary | ICD-10-CM | POA: Insufficient documentation

## 2018-09-18 DIAGNOSIS — G4733 Obstructive sleep apnea (adult) (pediatric): Secondary | ICD-10-CM | POA: Insufficient documentation

## 2018-09-18 DIAGNOSIS — R739 Hyperglycemia, unspecified: Secondary | ICD-10-CM

## 2018-09-18 DIAGNOSIS — Z1159 Encounter for screening for other viral diseases: Secondary | ICD-10-CM

## 2018-09-18 DIAGNOSIS — J45909 Unspecified asthma, uncomplicated: Secondary | ICD-10-CM | POA: Insufficient documentation

## 2018-09-18 DIAGNOSIS — F909 Attention-deficit hyperactivity disorder, unspecified type: Secondary | ICD-10-CM | POA: Insufficient documentation

## 2018-09-18 DIAGNOSIS — G43909 Migraine, unspecified, not intractable, without status migrainosus: Secondary | ICD-10-CM | POA: Insufficient documentation

## 2018-09-18 DIAGNOSIS — J309 Allergic rhinitis, unspecified: Secondary | ICD-10-CM | POA: Insufficient documentation

## 2018-09-18 DIAGNOSIS — Q282 Arteriovenous malformation of cerebral vessels: Secondary | ICD-10-CM | POA: Insufficient documentation

## 2018-09-18 DIAGNOSIS — R7303 Prediabetes: Secondary | ICD-10-CM | POA: Insufficient documentation

## 2018-09-18 DIAGNOSIS — G8929 Other chronic pain: Secondary | ICD-10-CM | POA: Insufficient documentation

## 2018-09-18 DIAGNOSIS — G959 Disease of spinal cord, unspecified: Secondary | ICD-10-CM | POA: Insufficient documentation

## 2018-09-18 DIAGNOSIS — Z0184 Encounter for antibody response examination: Secondary | ICD-10-CM

## 2018-09-18 LAB — COMPREHENSIVE METABOLIC PANEL
ALT: 44 U/L (ref 0–53)
AST: 28 U/L (ref 0–37)
Albumin: 4.5 g/dL (ref 3.5–5.2)
Alkaline Phosphatase: 70 U/L (ref 39–117)
BUN: 15 mg/dL (ref 6–23)
CO2: 25 mEq/L (ref 19–32)
Calcium: 9.7 mg/dL (ref 8.4–10.5)
Chloride: 103 mEq/L (ref 96–112)
Creatinine, Ser: 0.84 mg/dL (ref 0.40–1.50)
GFR: 97.6 mL/min (ref >60.00)
Glucose, Bld: 140 mg/dL — ABNORMAL HIGH (ref 70–99)
Potassium: 4.3 mEq/L (ref 3.5–5.1)
Sodium: 138 mEq/L (ref 135–145)
Total Bilirubin: 0.5 mg/dL (ref 0.2–1.2)
Total Protein: 7.2 g/dL (ref 6.0–8.3)

## 2018-09-18 LAB — HEMOGLOBIN A1C: Hgb A1c MFr Bld: 6.1 % (ref 4.6–6.5)

## 2018-09-18 LAB — LIPID PANEL
Cholesterol: 176 mg/dL (ref 0–200)
HDL: 56.4 mg/dL (ref >39.00)
NonHDL: 119.68
Total CHOL/HDL Ratio: 3
Triglycerides: 220 mg/dL — ABNORMAL HIGH (ref 0.0–149.0)
VLDL: 44 mg/dL — ABNORMAL HIGH (ref 0.0–40.0)

## 2018-09-18 LAB — CBC WITH DIFFERENTIAL/PLATELET
Basophils Absolute: 0 10*3/uL (ref 0.0–0.1)
Basophils Relative: 0.6 % (ref 0.0–3.0)
Eosinophils Absolute: 0.9 10*3/uL — ABNORMAL HIGH (ref 0.0–0.7)
Eosinophils Relative: 12.3 % — ABNORMAL HIGH (ref 0.0–5.0)
HCT: 45.6 % (ref 39.0–52.0)
Hemoglobin: 15.5 g/dL (ref 13.0–17.0)
Lymphocytes Relative: 26 % (ref 12.0–46.0)
Lymphs Abs: 1.8 10*3/uL (ref 0.7–4.0)
MCHC: 34 g/dL (ref 30.0–36.0)
MCV: 85.3 fl (ref 78.0–100.0)
Monocytes Absolute: 0.6 10*3/uL (ref 0.1–1.0)
Monocytes Relative: 8.9 % (ref 3.0–12.0)
Neutro Abs: 3.7 10*3/uL (ref 1.4–7.7)
Neutrophils Relative %: 52.2 % (ref 43.0–77.0)
Platelets: 207 10*3/uL (ref 150.0–400.0)
RBC: 5.35 Mil/uL (ref 4.22–5.81)
RDW: 13.9 % (ref 11.5–15.5)
WBC: 7.1 10*3/uL (ref 4.0–10.5)

## 2018-09-18 LAB — VITAMIN D 25 HYDROXY (VIT D DEFICIENCY, FRACTURES): VITD: 30.27 ng/mL (ref 30.00–100.00)

## 2018-09-18 LAB — T4, FREE: Free T4: 0.77 ng/dL (ref 0.60–1.60)

## 2018-09-18 LAB — TSH: TSH: 2.41 u[IU]/mL (ref 0.35–4.50)

## 2018-09-18 LAB — LDL CHOLESTEROL, DIRECT: Direct LDL: 93 mg/dL

## 2018-09-18 MED ORDER — FLUTICASONE PROPIONATE 50 MCG/ACT NA SUSP: 2.0000 | Freq: Every day | NASAL | 11 refills | Status: AC | PRN

## 2018-09-18 MED ORDER — ALBUTEROL SULFATE HFA 108 (90 BASE) MCG/ACT IN AERS: 1.0000 | INHALATION_SPRAY | Freq: Four times a day (QID) | RESPIRATORY_TRACT | 11 refills | Status: AC | PRN

## 2018-09-18 MED ORDER — BUDESONIDE-FORMOTEROL FUMARATE 80-4.5 MCG/ACT IN AERO: 2.0000 | INHALATION_SPRAY | Freq: Two times a day (BID) | RESPIRATORY_TRACT | 12 refills | Status: AC

## 2018-09-18 NOTE — Progress Notes (Signed)
Did he have his BP meds today?   If so call patient we need to add med for BP Either fluid pill like hydrochlorothiazide if he is not allergic to sulfa to take in the am   OR  norvasc daily   Which pill would he prefer to add goal blood pressure <130/<80    

## 2018-09-18 NOTE — Telephone Encounter (Signed)
Did he have his BP meds today?   If so call patient we need to add med for BP Either fluid pill like hydrochlorothiazide if he is not allergic to sulfa to take in the am   OR  norvasc daily   Which pill would he prefer to add goal blood pressure <130/<80

## 2018-09-18 NOTE — Progress Notes (Signed)
Patient presented today for nurse visit BP check per order from OV note on 09/12/18.   Patient reports compliance with prescribed BP medications: YES   Last dose of BP medication: this morning  BP Readings from Last 3 Encounters:  09/18/18 (!) 148/90  06/02/17 (!) 150/88  03/05/16 (!) 135/91   Pulse Readings from Last 3 Encounters:  09/18/18 67  06/03/17 81  03/05/16 (!) 105   Pt forgot to bring his bp machine from home for calibration.  Pt said that he checked his bp at home this morning and his home bp reading was 167/83 and hr was 43.  Dorise Bullion, CMA

## 2018-09-18 NOTE — Patient Instructions (Addendum)
Chronic Pain, Adult Chronic pain is a type of pain that lasts or keeps coming back (recurs) for at least six months. You may have chronic headaches, abdominal pain, or body pain. Chronic pain may be related to an illness, such as fibromyalgia or complex regional pain syndrome. Sometimes the cause of chronic pain is not known. Chronic pain can make it hard for you to do daily activities. If not treated, chronic pain can lead to other health problems, including anxiety and depression. Treatment depends on the cause and severity of your pain. You may need to work with a pain specialist to come up with a treatment plan. The plan may include medicine, counseling, and physical therapy. Many people benefit from a combination of two or more types of treatment to control their pain. Follow these instructions at home: Lifestyle  Consider keeping a pain diary to share with your health care providers.  Consider talking with a mental health care provider (psychologist) about how to cope with chronic pain.  Consider joining a chronic pain support group.  Try to control or lower your stress levels. Talk to your health care provider about strategies to do this. General instructions   Take over-the-counter and prescription medicines only as told by your health care provider.  Follow your treatment plan as told by your health care provider. This may include: ? Gentle, regular exercise. ? Eating a healthy diet that includes foods such as vegetables, fruits, fish, and lean meats. ? Cognitive or behavioral therapy. ? Working with a Adult nurse. ? Meditation or yoga. ? Acupuncture or massage therapy. ? Aroma, color, light, or sound therapy. ? Local electrical stimulation. ? Shots (injections) of numbing or pain-relieving medicines into the spine or the area of pain.  Check your pain level as told by your health care provider. Ask your health care provider if you should use a pain scale.  Learn as  much as you can about how to manage your chronic pain. Ask your health care provider if an intensive pain rehabilitation program or a chronic pain specialist would be helpful.  Keep all follow-up visits as told by your health care provider. This is important. Contact a health care provider if:  Your pain gets worse.  You have new pain.  You have trouble sleeping.  You have trouble doing your normal activities.  Your pain is not controlled with treatment.  Your have side effects from pain medicine.  You feel weak. Get help right away if:  You lose feeling or have numbness in your body.  You lose control of bowel or bladder function.  Your pain suddenly gets much worse.  You develop shaking or chills.  You develop confusion.  You develop chest pain.  You have trouble breathing or shortness of breath.  You pass out.  You have thoughts about hurting yourself or others. This information is not intended to replace advice given to you by your health care provider. Make sure you discuss any questions you have with your health care provider. Document Released: 10/24/2001 Document Revised: 01/14/2017 Document Reviewed: 07/22/2015 Elsevier Patient Education  2020 Elsevier Inc.   DASH Eating Plan DASH stands for "Dietary Approaches to Stop Hypertension." The DASH eating plan is a healthy eating plan that has been shown to reduce high blood pressure (hypertension). It may also reduce your risk for type 2 diabetes, heart disease, and stroke. The DASH eating plan may also help with weight loss. What are tips for following this plan?  General guidelines  Avoid eating more than 2,300 mg (milligrams) of salt (sodium) a day. If you have hypertension, you may need to reduce your sodium intake to 1,500 mg a day.  Limit alcohol intake to no more than 1 drink a day for nonpregnant women and 2 drinks a day for men. One drink equals 12 oz of beer, 5 oz of wine, or 1 oz of hard liquor.   Work with your health care provider to maintain a healthy body weight or to lose weight. Ask what an ideal weight is for you.  Get at least 30 minutes of exercise that causes your heart to beat faster (aerobic exercise) most days of the week. Activities may include walking, swimming, or biking.  Work with your health care provider or diet and nutrition specialist (dietitian) to adjust your eating plan to your individual calorie needs. Reading food labels   Check food labels for the amount of sodium per serving. Choose foods with less than 5 percent of the Daily Value of sodium. Generally, foods with less than 300 mg of sodium per serving fit into this eating plan.  To find whole grains, look for the word "whole" as the first word in the ingredient list. Shopping  Buy products labeled as "low-sodium" or "no salt added."  Buy fresh foods. Avoid canned foods and premade or frozen meals. Cooking  Avoid adding salt when cooking. Use salt-free seasonings or herbs instead of table salt or sea salt. Check with your health care provider or pharmacist before using salt substitutes.  Do not fry foods. Cook foods using healthy methods such as baking, boiling, grilling, and broiling instead.  Cook with heart-healthy oils, such as olive, canola, soybean, or sunflower oil. Meal planning  Eat a balanced diet that includes: ? 5 or more servings of fruits and vegetables each day. At each meal, try to fill half of your plate with fruits and vegetables. ? Up to 6-8 servings of whole grains each day. ? Less than 6 oz of lean meat, poultry, or fish each day. A 3-oz serving of meat is about the same size as a deck of cards. One egg equals 1 oz. ? 2 servings of low-fat dairy each day. ? A serving of nuts, seeds, or beans 5 times each week. ? Heart-healthy fats. Healthy fats called Omega-3 fatty acids are found in foods such as flaxseeds and coldwater fish, like sardines, salmon, and mackerel.  Limit how  much you eat of the following: ? Canned or prepackaged foods. ? Food that is high in trans fat, such as fried foods. ? Food that is high in saturated fat, such as fatty meat. ? Sweets, desserts, sugary drinks, and other foods with added sugar. ? Full-fat dairy products.  Do not salt foods before eating.  Try to eat at least 2 vegetarian meals each week.  Eat more home-cooked food and less restaurant, buffet, and fast food.  When eating at a restaurant, ask that your food be prepared with less salt or no salt, if possible. What foods are recommended? The items listed may not be a complete list. Talk with your dietitian about what dietary choices are best for you. Grains Whole-grain or whole-wheat bread. Whole-grain or whole-wheat pasta. Brown rice. Orpah Cobbatmeal. Quinoa. Bulgur. Whole-grain and low-sodium cereals. Pita bread. Low-fat, low-sodium crackers. Whole-wheat flour tortillas. Vegetables Fresh or frozen vegetables (raw, steamed, roasted, or grilled). Low-sodium or reduced-sodium tomato and vegetable juice. Low-sodium or reduced-sodium tomato sauce and tomato paste. Low-sodium or reduced-sodium canned vegetables. Fruits  All fresh, dried, or frozen fruit. Canned fruit in natural juice (without added sugar). Meat and other protein foods Skinless chicken or Malawiturkey. Ground chicken or [place]urkey. Pork with fat trimmed off. Fish and seafood. Egg whites. Dried beans, peas, or lentils. Unsalted nuts, nut butters, and seeds. Unsalted canned beans. Lean cuts of beef with fat trimmed off. Low-sodium, lean deli meat. Dairy Low-fat (1%) or fat-free (skim) milk. Fat-free, low-fat, or reduced-fat cheeses. Nonfat, low-sodium ricotta or cottage cheese. Low-fat or nonfat yogurt. Low-fat, low-sodium cheese. Fats and oils Soft margarine without trans fats. Vegetable oil. Low-fat, reduced-fat, or light mayonnaise and salad dressings (reduced-sodium). Canola, safflower, olive, soybean, and sunflower oils. Avocado.  Seasoning and other foods Herbs. Spices. Seasoning mixes without salt. Unsalted popcorn and pretzels. Fat-free sweets. What foods are not recommended? The items listed may not be a complete list. Talk with your dietitian about what dietary choices are best for you. Grains Baked goods made with fat, such as croissants, muffins, or some breads. Dry pasta or rice meal packs. Vegetables Creamed or fried vegetables. Vegetables in a cheese sauce. Regular canned vegetables (not low-sodium or reduced-sodium). Regular canned tomato sauce and paste (not low-sodium or reduced-sodium). Regular tomato and vegetable juice (not low-sodium or reduced-sodium). Rosita FirePickles. Olives. Fruits Canned fruit in a light or heavy syrup. Fried fruit. Fruit in cream or butter sauce. Meat and other protein foods Fatty cuts of meat. Ribs. Fried meat. Tomasa BlaseBacon. Sausage. Bologna and other processed lunch meats. Salami. Fatback. Hotdogs. Bratwurst. Salted nuts and seeds. Canned beans with added salt. Canned or smoked fish. Whole eggs or egg yolks. Chicken or Malaw[place]urkey with skin. Dairy Whole or 2% milk, cream, and half-and-half. Whole or full-fat cream cheese. Whole-fat or sweetened yogurt. Full-fat cheese. Nondairy creamers. Whipped toppings. Processed cheese and cheese spreads. Fats and oils Butter. Stick margarine. Lard. Shortening. Ghee. Bacon fat. Tropical oils, such as coconut, palm kernel, or palm oil. Seasoning and other foods Salted popcorn and pretzels. Onion salt, garlic salt, seasoned salt, table salt, and sea salt. Worcestershire sauce. Tartar sauce. Barbecue sauce. Teriyaki sauce. Soy sauce, including reduced-sodium. Steak sauce. Canned and packaged gravies. Fish sauce. Oyster sauce. Cocktail sauce. Horseradish that you find on the shelf. Ketchup. Mustard. Meat flavorings and tenderizers. Bouillon cubes. Hot sauce and Tabasco sauce. Premade or packaged marinades. Premade or packaged taco seasonings. Relishes. Regular salad  dressings. Where to find more information:  National Heart, Lung, and Blood Institute: PopSteam.iswww.nhlbi.nih.gov  American Heart Association: www.heart.org Summary  The DASH eating plan is a healthy eating plan that has been shown to reduce high blood pressure (hypertension). It may also reduce your risk for type 2 diabetes, heart disease, and stroke.  With the DASH eating plan, you should limit salt (sodium) intake to 2,300 mg a day. If you have hypertension, you may need to reduce your sodium intake to 1,500 mg a day.  When on the DASH eating plan, aim to eat more fresh fruits and vegetables, whole grains, lean proteins, low-fat dairy, and heart-healthy fats.  Work with your health care provider or diet and nutrition specialist (dietitian) to adjust your eating plan to your individual calorie needs. This information is not intended to replace advice given to you by your health care provider. Make sure you discuss any questions you have with your health care provider. Document Released: 01/21/2011 Document Revised: 01/14/2017 Document Reviewed: 01/26/2016 Elsevier Patient Education  2020 [employer]  Hypertension, Adult Hypertension is another name for high blood pressure. High blood pressure forces your  heart to work harder to pump blood. This can cause problems over time. There are two numbers in a blood pressure reading. There is a top number (systolic) over a bottom number (diastolic). It is best to have a blood pressure that is below 120/80. Healthy choices can help lower your blood pressure, or you may need medicine to help lower it. What are the causes? The cause of this condition is not known. Some conditions may be related to high blood pressure. What increases the risk?  Smoking.  Having type 2 diabetes mellitus, high cholesterol, or both.  Not getting enough exercise or physical activity.  Being overweight.  Having too much fat, sugar, calories, or salt (sodium) in your diet.   Drinking too much alcohol.  Having long-term (chronic) kidney disease.  Having a family history of high blood pressure.  Age. Risk increases with age.  Race. You may be at higher risk if you are African American.  Gender. Men are at higher risk than women before age 48. After age 48, women are at higher risk than men.  Having obstructive sleep apnea.  Stress. What are the signs or symptoms?  High blood pressure may not cause symptoms. Very high blood pressure (hypertensive crisis) may cause: ? Headache. ? Feelings of worry or nervousness (anxiety). ? Shortness of breath. ? Nosebleed. ? A feeling of being sick to your stomach (nausea). ? Throwing up (vomiting). ? Changes in how you see. ? Very bad chest pain. ? Seizures. How is this treated?  This condition is treated by making healthy lifestyle changes, such as: ? Eating healthy foods. ? Exercising more. ? Drinking less alcohol.  Your health care provider may prescribe medicine if lifestyle changes are not enough to get your blood pressure under control, and if: ? Your top number is above 130. ? Your bottom number is above 80.  Your personal target blood pressure may vary. Follow these instructions at home: Eating and drinking   If told, follow the DASH eating plan. To follow this plan: ? Fill one half of your plate at each meal with fruits and vegetables. ? Fill one fourth of your plate at each meal with whole grains. Whole grains include whole-wheat pasta, brown rice, and whole-grain bread. ? Eat or drink low-fat dairy products, such as skim milk or low-fat yogurt. ? Fill one fourth of your plate at each meal with low-fat (lean) proteins. Low-fat proteins include fish, chicken without skin, eggs, beans, and tofu. ? Avoid fatty meat, cured and processed meat, or chicken with skin. ? Avoid pre-made or processed food.  Eat less than 1,500 mg of salt each day.  Do not drink alcohol if: ? Your doctor tells you  not to drink. ? You are pregnant, may be pregnant, or are planning to become pregnant.  If you drink alcohol: ? Limit how much you use to:  0-1 drink a day for women.  0-2 drinks a day for men. ? Be aware of how much alcohol is in your drink. In the [place] one drink equals one 12 oz bottle of beer (355 mL), one 5 oz glass of wine (148 mL), or one 1 oz glass of hard liquor (44 mL). Lifestyle   Work with your doctor to stay at a healthy weight or to lose weight. Ask your doctor what the best weight is for you.  Get at least 30 minutes of exercise most days of the week. This may include walking, swimming, or biking.  Get at  least 30 minutes of exercise that strengthens your muscles (resistance exercise) at least 3 days a week. This may include lifting weights or doing Pilates.  Do not use any products that contain nicotine or tobacco, such as cigarettes, e-cigarettes, and chewing tobacco. If you need help quitting, ask your doctor.  Check your blood pressure at home as told by your doctor.  Keep all follow-up visits as told by your doctor. This is important. Medicines  Take over-the-counter and prescription medicines only as told by your doctor. Follow directions carefully.  Do not skip doses of blood pressure medicine. The medicine does not work as well if you skip doses. Skipping doses also puts you at risk for problems.  Ask your doctor about side effects or reactions to medicines that you should watch for. Contact a doctor if you:  Think you are having a reaction to the medicine you are taking.  Have headaches that keep coming back (recurring).  Feel dizzy.  Have swelling in your ankles.  Have trouble with your vision. Get help right away if you:  Get a very bad headache.  Start to feel mixed up (confused).  Feel weak or numb.  Feel faint.  Have very bad pain in your: ? Chest. ? Belly (abdomen).  Throw up more than once.  Have trouble breathing. Summary   Hypertension is another name for high blood pressure.  High blood pressure forces your heart to work harder to pump blood.  For most people, a normal blood pressure is less than 120/80.  Making healthy choices can help lower blood pressure. If your blood pressure does not get lower with healthy choices, you may need to take medicine. This information is not intended to replace advice given to you by your health care provider. Make sure you discuss any questions you have with your health care provider. Document Released: 07/21/2007 Document Revised: 10/12/2017 Document Reviewed: 10/12/2017 Elsevier Patient Education  2020 [employer].

## 2018-09-19 LAB — HEPATITIS A ANTIBODY, TOTAL: Hepatitis A AB,Total: REACTIVE — AB

## 2018-09-19 LAB — URINALYSIS, ROUTINE W REFLEX MICROSCOPIC
Bilirubin Urine: NEGATIVE
Glucose, UA: NEGATIVE
Hgb urine dipstick: NEGATIVE
Ketones, ur: NEGATIVE
Leukocytes,Ua: NEGATIVE
Nitrite: NEGATIVE
Protein, ur: NEGATIVE
Specific Gravity, Urine: 1.021 (ref 1.001–1.03)
pH: <=5 (ref 5.0–8.0)

## 2018-09-19 LAB — HEPATITIS B SURFACE ANTIBODY, QUANTITATIVE: Hep B S AB Quant (Post): 36 m[IU]/mL (ref >=10)

## 2018-09-19 NOTE — Telephone Encounter (Signed)
Left voicemail for pt to return call. Okay for PEC to give message also.

## 2018-09-20 ENCOUNTER — Ambulatory Visit: Payer: Medicare Other

## 2018-09-22 ENCOUNTER — Other Ambulatory Visit: Payer: Self-pay | Admitting: Internal Medicine

## 2018-09-22 NOTE — Addendum Note (Signed)
Addended by: Orland Mustard on: 09/22/2018 12:37 PM   Modules accepted: Orders

## 2018-09-26 ENCOUNTER — Telehealth: Payer: Self-pay | Admitting: Internal Medicine

## 2018-09-26 NOTE — Telephone Encounter (Signed)
I called pt and left a vm to call ofc to sch a 3-4 month follow up.

## 2018-10-03 ENCOUNTER — Telehealth: Payer: Self-pay | Admitting: *Deleted

## 2018-10-03 NOTE — Telephone Encounter (Signed)
Copied from [place] [phone]. Topic: General - Other >> Oct 03, 2018 10:22 AM Leward Quan A wrote: Reason for CRM: Patient called to let the nurse know he forgot to give his BP readings. On 10/02/2018 120/72 and 10/03/2018 123/68. Any questions please call

## 2018-10-03 NOTE — Telephone Encounter (Signed)
Noted  Blood pressure at goal <130/<80 if above this call the clinic and we will need to change meds   [name]

## 2018-10-03 NOTE — Telephone Encounter (Signed)
Called pt. No answer. LMTCB

## 2018-12-27 ENCOUNTER — Other Ambulatory Visit: Payer: Self-pay | Admitting: Internal Medicine

## 2018-12-27 DIAGNOSIS — G8929 Other chronic pain: Secondary | ICD-10-CM

## 2018-12-27 MED ORDER — IBUPROFEN 800 MG PO TABS: 800.0000 mg | ORAL_TABLET | Freq: Three times a day (TID) | ORAL | 0 refills | Status: DC | PRN

## 2019-02-13 ENCOUNTER — Telehealth: Payer: Self-pay | Admitting: Internal Medicine

## 2019-02-13 DIAGNOSIS — G8929 Other chronic pain: Secondary | ICD-10-CM

## 2019-02-13 MED ORDER — IBUPROFEN 800 MG PO TABS: 800.0000 mg | ORAL_TABLET | Freq: Three times a day (TID) | ORAL | 0 refills | Status: DC | PRN

## 2019-02-13 NOTE — Telephone Encounter (Signed)
Pt needs a refill on his  ibuprofen (ADVIL) 800 MG tablet.

## 2019-02-13 NOTE — Telephone Encounter (Signed)
Order has been completed.

## 2019-04-04 ENCOUNTER — Telehealth: Payer: Self-pay | Admitting: Internal Medicine

## 2019-04-04 NOTE — Telephone Encounter (Signed)
Left message for patient to call back and schedule Medicare Annual Wellness Visit (AWV) either virtually or audio only.  No hx of AWV; please schedule at anytime with Denisa O'Brien-Blaney at [facility] in 2013

## 2019-05-08 ENCOUNTER — Encounter: Payer: Self-pay | Admitting: Emergency Medicine

## 2019-05-08 ENCOUNTER — Other Ambulatory Visit: Payer: Self-pay

## 2019-05-08 DIAGNOSIS — K625 Hemorrhage of anus and rectum: Secondary | ICD-10-CM | POA: Diagnosis present

## 2019-05-08 DIAGNOSIS — R140 Abdominal distension (gaseous): Secondary | ICD-10-CM | POA: Diagnosis not present

## 2019-05-08 DIAGNOSIS — Z5321 Procedure and treatment not carried out due to patient leaving prior to being seen by health care provider: Secondary | ICD-10-CM | POA: Diagnosis not present

## 2019-05-08 DIAGNOSIS — R1032 Left lower quadrant pain: Secondary | ICD-10-CM | POA: Insufficient documentation

## 2019-05-08 LAB — URINALYSIS, COMPLETE (UACMP) WITH MICROSCOPIC
Bacteria, UA: NONE SEEN
Bilirubin Urine: NEGATIVE
Glucose, UA: NEGATIVE mg/dL
Hgb urine dipstick: NEGATIVE
Ketones, ur: NEGATIVE mg/dL
Leukocytes,Ua: NEGATIVE
Nitrite: NEGATIVE
Protein, ur: NEGATIVE mg/dL
Specific Gravity, Urine: 1.026 (ref 1.005–1.030)
pH: 5 (ref 5.0–8.0)

## 2019-05-08 LAB — CBC WITH DIFFERENTIAL/PLATELET
Abs Immature Granulocytes: 0.03 10*3/uL (ref 0.00–0.07)
Basophils Absolute: 0.1 10*3/uL (ref 0.0–0.1)
Basophils Relative: 1 %
Eosinophils Absolute: 0.5 10*3/uL (ref 0.0–0.5)
Eosinophils Relative: 5 %
HCT: 45 % (ref 39.0–52.0)
Hemoglobin: 15.7 g/dL (ref 13.0–17.0)
Immature Granulocytes: 0 %
Lymphocytes Relative: 14 %
Lymphs Abs: 1.5 10*3/uL (ref 0.7–4.0)
MCH: 28.8 pg (ref 26.0–34.0)
MCHC: 34.9 g/dL (ref 30.0–36.0)
MCV: 82.6 fL (ref 80.0–100.0)
Monocytes Absolute: 0.9 10*3/uL (ref 0.1–1.0)
Monocytes Relative: 8 %
Neutro Abs: 8 10*3/uL — ABNORMAL HIGH (ref 1.7–7.7)
Neutrophils Relative %: 72 %
Platelets: 223 10*3/uL (ref 150–400)
RBC: 5.45 MIL/uL (ref 4.22–5.81)
RDW: 12.5 % (ref 11.5–15.5)
WBC: 11 10*3/uL — ABNORMAL HIGH (ref 4.0–10.5)
nRBC: 0 % (ref 0.0–0.2)

## 2019-05-08 LAB — COMPREHENSIVE METABOLIC PANEL
ALT: 76 U/L — ABNORMAL HIGH (ref 0–44)
AST: 57 U/L — ABNORMAL HIGH (ref 15–41)
Albumin: 4.8 g/dL (ref 3.5–5.0)
Alkaline Phosphatase: 65 U/L (ref 38–126)
Anion gap: 11 (ref 5–15)
BUN: 19 mg/dL (ref 6–20)
CO2: 23 mmol/L (ref 22–32)
Calcium: 9.4 mg/dL (ref 8.9–10.3)
Chloride: 101 mmol/L (ref 98–111)
Creatinine, Ser: 0.95 mg/dL (ref 0.61–1.24)
GFR calc Af Amer: >60 mL/min (ref >60)
GFR calc non Af Amer: >60 mL/min (ref >60)
Glucose, Bld: 111 mg/dL — ABNORMAL HIGH (ref 70–99)
Potassium: 4 mmol/L (ref 3.5–5.1)
Sodium: 135 mmol/L (ref 135–145)
Total Bilirubin: 1.4 mg/dL — ABNORMAL HIGH (ref 0.3–1.2)
Total Protein: 8.1 g/dL (ref 6.5–8.1)

## 2019-05-08 LAB — LIPASE, BLOOD: Lipase: 32 U/L (ref 11–51)

## 2019-05-08 NOTE — ED Triage Notes (Signed)
error 

## 2019-05-08 NOTE — ED Triage Notes (Signed)
Patient ambulatory to triage with steady gait, without difficulty or distress noted, mask in place; pt reports dark rectal bleeding since this am accomp by left lower abd pain bloating

## 2019-05-09 ENCOUNTER — Telehealth: Payer: Self-pay | Admitting: Emergency Medicine

## 2019-05-09 ENCOUNTER — Emergency Department
Admission: EM | Admit: 2019-05-09 | Discharge: 2019-05-09 | Disposition: A | Discharge status: Home / Self Care | Payer: Medicare Other | Attending: Emergency Medicine | Admitting: Emergency Medicine

## 2019-05-09 NOTE — ED Notes (Signed)
Pt to registration desk, states he is asking about wait times, informed him we cannot give out wait times. Pt stated he would be going to [facility], pt states "they won't take a sample from me back there and I'm bleeding internally"  Pt stated he would be calling for a ride.  NO distress noted on departure.

## 2019-05-09 NOTE — Telephone Encounter (Signed)
Call to sch f/u appt  TMS

## 2019-05-09 NOTE — Telephone Encounter (Signed)
Called patient due to lwot to inquire about condition and follow up plans. Says call cannot becompleted .

## 2019-05-10 NOTE — Telephone Encounter (Signed)
No answer, no voicemail.

## 2019-05-11 ENCOUNTER — Other Ambulatory Visit: Payer: Self-pay | Admitting: Internal Medicine

## 2019-05-11 DIAGNOSIS — G8929 Other chronic pain: Secondary | ICD-10-CM

## 2019-05-11 MED ORDER — IBUPROFEN 800 MG PO TABS: 800.0000 mg | ORAL_TABLET | Freq: Three times a day (TID) | ORAL | 0 refills | Status: DC | PRN

## 2019-05-28 NOTE — Telephone Encounter (Signed)
Pt states when going to [facility] ER that he was placed behind a lot of other patients and was waiting in pain for hours. States he left and went to the [place] ER and then was referred to [facility]. States they diagnosed him with an Ulcer.   Patient states his Brother had Covid so the [place] tested him. States he has been having some body aches so he will be going to be tested again through [facility]. States he does not want to go back to the hospital to do the [facility] scheduled testing.   The patient has been scheduled for a video visit 04/23 at 2:00 pm for a follow up. He would also like to discuss Antibody Covid testing and imaging to check for arthritis. Patient states the body aches could just be arthritis as he used to be on Celebrex but the [place] took him off due to possible interactions with another of his medications.

## 2019-05-28 NOTE — Telephone Encounter (Signed)
Noted will see pt 06/08/19 appt

## 2019-06-08 ENCOUNTER — Encounter: Payer: Self-pay | Admitting: Internal Medicine

## 2019-06-08 ENCOUNTER — Telehealth (INDEPENDENT_AMBULATORY_CARE_PROVIDER_SITE_OTHER): Payer: Medicare Other | Admitting: Internal Medicine

## 2019-06-08 VITALS — BP 149/82 | Ht 69.0 in | Wt 208.0 lb

## 2019-06-08 DIAGNOSIS — E785 Hyperlipidemia, unspecified: Secondary | ICD-10-CM

## 2019-06-08 DIAGNOSIS — K279 Peptic ulcer, site unspecified, unspecified as acute or chronic, without hemorrhage or perforation: Secondary | ICD-10-CM

## 2019-06-08 DIAGNOSIS — R748 Abnormal levels of other serum enzymes: Secondary | ICD-10-CM | POA: Insufficient documentation

## 2019-06-08 DIAGNOSIS — M549 Dorsalgia, unspecified: Secondary | ICD-10-CM

## 2019-06-08 DIAGNOSIS — D72829 Elevated white blood cell count, unspecified: Secondary | ICD-10-CM

## 2019-06-08 DIAGNOSIS — M19042 Primary osteoarthritis, left hand: Secondary | ICD-10-CM

## 2019-06-08 DIAGNOSIS — Z20822 Contact with and (suspected) exposure to covid-19: Secondary | ICD-10-CM

## 2019-06-08 DIAGNOSIS — G8929 Other chronic pain: Secondary | ICD-10-CM

## 2019-06-08 DIAGNOSIS — I10 Essential (primary) hypertension: Secondary | ICD-10-CM

## 2019-06-08 DIAGNOSIS — K760 Fatty (change of) liver, not elsewhere classified: Secondary | ICD-10-CM

## 2019-06-08 DIAGNOSIS — M19041 Primary osteoarthritis, right hand: Secondary | ICD-10-CM

## 2019-06-08 DIAGNOSIS — R7303 Prediabetes: Secondary | ICD-10-CM

## 2019-06-08 MED ORDER — DICLOFENAC SODIUM 1 % EX GEL: 2.0000 g | Freq: Four times a day (QID) | CUTANEOUS | 11 refills | Status: AC

## 2019-06-08 MED ORDER — ACETAMINOPHEN ER 650 MG PO TBCR: 650.0000 mg | EXTENDED_RELEASE_TABLET | Freq: Three times a day (TID) | ORAL | 3 refills | Status: DC | PRN

## 2019-06-08 MED ORDER — AMLODIPINE BESYLATE 2.5 MG PO TABS: 2.5000 mg | ORAL_TABLET | Freq: Every day | ORAL | 3 refills | Status: AC

## 2019-06-08 NOTE — Patient Instructions (Addendum)
Try theraworx over the counter topical for cramps  Drink 55-64 ounces of water daily  Read info below  If you have blood in your stool consider protonix prescription or prilosec or nexium over the counter daily and referral to GI (stomach doctor in the future)    Gastroesophageal Reflux Disease, Adult Gastroesophageal reflux (GER) happens when acid from the stomach flows up into the tube that connects the mouth and the stomach (esophagus). Normally, food travels down the esophagus and stays in the stomach to be digested. However, when a person has GER, food and stomach acid sometimes move back up into the esophagus. If this becomes a more serious problem, the person may be diagnosed with a disease called gastroesophageal reflux disease (GERD). GERD occurs when the reflux:  Happens often.  Causes frequent or severe symptoms.  Causes problems such as damage to the esophagus. When stomach acid comes in contact with the esophagus, the acid may cause soreness (inflammation) in the esophagus. Over time, GERD may create small holes (ulcers) in the lining of the esophagus. What are the causes? This condition is caused by a problem with the muscle between the esophagus and the stomach (lower esophageal sphincter, or LES). Normally, the LES muscle closes after food passes through the esophagus to the stomach. When the LES is weakened or abnormal, it does not close properly, and that allows food and stomach acid to go back up into the esophagus. The LES can be weakened by certain dietary substances, medicines, and medical conditions, including:  Tobacco use.  Pregnancy.  Having a hiatal hernia.  Alcohol use.  Certain foods and beverages, such as coffee, chocolate, onions, and peppermint. What increases the risk? You are more likely to develop this condition if you:  Have an increased body weight.  Have a connective tissue disorder.  Use NSAID medicines. What are the signs or  symptoms? Symptoms of this condition include:  Heartburn.  Difficult or painful swallowing.  The feeling of having a lump in the throat.  Abitter taste in the mouth.  Bad breath.  Having a large amount of saliva.  Having an upset or bloated stomach.  Belching.  Chest pain. Different conditions can cause chest pain. Make sure you see your health care provider if you experience chest pain.  Shortness of breath or wheezing.  Ongoing (chronic) cough or a night-time cough.  Wearing away of tooth enamel.  Weight loss. How is this diagnosed? Your health care provider will take a medical history and perform a physical exam. To determine if you have mild or severe GERD, your health care provider may also monitor how you respond to treatment. You may also have tests, including:  A test to examine your stomach and esophagus with a small camera (endoscopy).  A test thatmeasures the acidity level in your esophagus.  A test thatmeasures how much pressure is on your esophagus.  A barium swallow or modified barium swallow test to show the shape, size, and functioning of your esophagus. How is this treated? The goal of treatment is to help relieve your symptoms and to prevent complications. Treatment for this condition may vary depending on how severe your symptoms are. Your health care provider may recommend:  Changes to your diet.  Medicine.  Surgery. Follow these instructions at home: Eating and drinking   Follow a diet as recommended by your health care provider. This may involve avoiding foods and drinks such as: ? Coffee and tea (with or without caffeine). ? Drinks that  containalcohol. ? Energy drinks and sports drinks. ? Carbonated drinks or sodas. ? Chocolate and cocoa. ? Peppermint and mint flavorings. ? Garlic and onions. ? Horseradish. ? Spicy and acidic foods, including peppers, chili powder, curry powder, vinegar, hot sauces, and barbecue sauce. ? Citrus  fruit juices and citrus fruits, such as oranges, lemons, and limes. ? Tomato-based foods, such as red sauce, chili, salsa, and pizza with red sauce. ? Fried and fatty foods, such as donuts, french fries, potato chips, and high-fat dressings. ? High-fat meats, such as hot dogs and fatty cuts of red and white meats, such as rib eye steak, sausage, ham, and bacon. ? High-fat dairy items, such as whole milk, butter, and cream cheese.  Eat small, frequent meals instead of large meals.  Avoid drinking large amounts of liquid with your meals.  Avoid eating meals during the 2-3 hours before bedtime.  Avoid lying down right after you eat.  Do not exercise right after you eat. Lifestyle   Do not use any products that contain nicotine or tobacco, such as cigarettes, e-cigarettes, and chewing tobacco. If you need help quitting, ask your health care provider.  Try to reduce your stress by using methods such as yoga or meditation. If you need help reducing stress, ask your health care provider.  If you are overweight, reduce your weight to an amount that is healthy for you. Ask your health care provider for guidance about a safe weight loss goal. General instructions  Pay attention to any changes in your symptoms.  Take over-the-counter and prescription medicines only as told by your health care provider. Do not take aspirin, ibuprofen, or other NSAIDs unless your health care provider told you to do so.  Wear loose-fitting clothing. Do not wear anything tight around your waist that causes pressure on your abdomen.  Raise (elevate) the head of your bed about 6 inches (15 cm).  Avoid bending over if this makes your symptoms worse.  Keep all follow-up visits as told by your health care provider. This is important. Contact a health care provider if:  You have: ? New symptoms. ? Unexplained weight loss. ? Difficulty swallowing or it hurts to swallow. ? Wheezing or a persistent cough. ? A  hoarse voice.  Your symptoms do not improve with treatment. Get help right away if you:  Have pain in your arms, neck, jaw, teeth, or back.  Feel sweaty, dizzy, or light-headed.  Have chest pain or shortness of breath.  Vomit and your vomit looks like blood or coffee grounds.  Faint.  Have stool that is bloody or black.  Cannot swallow, drink, or eat. Summary  Gastroesophageal reflux happens when acid from the stomach flows up into the esophagus. GERD is a disease in which the reflux happens often, causes frequent or severe symptoms, or causes problems such as damage to the esophagus.  Treatment for this condition may vary depending on how severe your symptoms are. Your health care provider may recommend diet and lifestyle changes, medicine, or surgery.  Contact a health care provider if you have new or worsening symptoms.  Take over-the-counter and prescription medicines only as told by your health care provider. Do not take aspirin, ibuprofen, or other NSAIDs unless your health care provider told you to do so.  Keep all follow-up visits as told by your health care provider. This is important. This information is not intended to replace advice given to you by your health care provider. Make sure you discuss any questions  you have with your health care provider. Document Revised: 08/10/2017 Document Reviewed: 08/10/2017 Elsevier Patient Education  2020 [employer].  Food Choices for Gastroesophageal Reflux Disease, Adult When you have gastroesophageal reflux disease (GERD), the foods you eat and your eating habits are very important. Choosing the right foods can help ease the discomfort of GERD. Consider working with a diet and nutrition specialist (dietitian) to help you make healthy food choices. What general guidelines should I follow?  Eating plan  Choose healthy foods low in fat, such as fruits, vegetables, whole grains, low-fat dairy products, and lean meat, fish, and  poultry.  Eat frequent, small meals instead of three large meals each day. Eat your meals slowly, in a relaxed setting. Avoid bending over or lying down until 2-3 hours after eating.  Limit high-fat foods such as fatty meats or fried foods.  Limit your intake of oils, butter, and shortening to less than 8 teaspoons each day.  Avoid the following: ? Foods that cause symptoms. These may be different for different people. Keep a food diary to keep track of foods that cause symptoms. ? Alcohol. ? Drinking large amounts of liquid with meals. ? Eating meals during the 2-3 hours before bed.  Cook foods using methods other than frying. This may include baking, grilling, or broiling. Lifestyle  Maintain a healthy weight. Ask your health care provider what weight is healthy for you. If you need to lose weight, work with your health care provider to do so safely.  Exercise for at least 30 minutes on 5 or more days each week, or as told by your health care provider.  Avoid wearing clothes that fit tightly around your waist and chest.  Do not use any products that contain nicotine or tobacco, such as cigarettes and e-cigarettes. If you need help quitting, ask your health care provider.  Sleep with the head of your bed raised. Use a wedge under the mattress or blocks under the bed frame to raise the head of the bed. What foods are not recommended? The items listed may not be a complete list. Talk with your dietitian about what dietary choices are best for you. Grains Pastries or quick breads with added fat. [place] toast. Vegetables Deep fried vegetables. [place] fries. Any vegetables prepared with added fat. Any vegetables that cause symptoms. For some people this may include tomatoes and tomato products, chili peppers, onions and garlic, and horseradish. Fruits Any fruits prepared with added fat. Any fruits that cause symptoms. For some people this may include citrus fruits, such as oranges,  grapefruit, pineapple, and lemons. Meats and other protein foods High-fat meats, such as fatty beef or pork, hot dogs, ribs, ham, sausage, salami and bacon. Fried meat or protein, including fried fish and fried chicken. Nuts and nut butters. Dairy Whole milk and chocolate milk. Sour cream. Cream. Ice cream. Cream cheese. Milk shakes. Beverages Coffee and tea, with or without caffeine. Carbonated beverages. Sodas. Energy drinks. Fruit juice made with acidic fruits (such as orange or grapefruit). Tomato juice. Alcoholic drinks. Fats and oils Butter. Margarine. Shortening. Ghee. Sweets and desserts Chocolate and cocoa. Donuts. Seasoning and other foods Pepper. Peppermint and spearmint. Any condiments, herbs, or seasonings that cause symptoms. For some people, this may include curry, hot sauce, or vinegar-based salad dressings. Summary  When you have gastroesophageal reflux disease (GERD), food and lifestyle choices are very important to help ease the discomfort of GERD.  Eat frequent, small meals instead of three large meals each day.  Eat your meals slowly, in a relaxed setting. Avoid bending over or lying down until 2-3 hours after eating.  Limit high-fat foods such as fatty meat or fried foods. This information is not intended to replace advice given to you by your health care provider. Make sure you discuss any questions you have with your health care provider. Document Revised: 05/25/2018 Document Reviewed: 02/03/2016 Elsevier Patient Education  2020 [employer]  Peptic Ulcer  A peptic ulcer is a sore in the lining of the stomach (gastric ulcer) or the first part of the small intestine (duodenal ulcer). The ulcer causes a gradual wearing away (erosion) of the deeper tissue. What are the causes? Normally, the lining of the stomach and the small intestine protects them from the acid that digests food. The protective lining can be damaged by:  An infection caused by a type of bacteria  called Helicobacter pylori or H. pylori.  Regular use of NSAIDs, such as ibuprofen or aspirin.  Rare tumors in the stomach, small intestine, or pancreas (Zollinger-Ellison syndrome). What increases the risk? The following factors may make you more likely to develop this condition:  Smoking.  Having a family history of ulcer disease.  Drinking alcohol.  Having been hospitalized in an intensive care unit (ICU). What are the signs or symptoms? Symptoms of this condition include:  Persistent burning pain in the area between the chest and the belly button. The pain may be worse on an empty stomach and at night.  Heartburn.  Nausea and vomiting.  Bloating. If the ulcer results in bleeding, it can cause:  Black, tarry stools.  Vomiting of bright red blood.  Vomiting of material that looks like coffee grounds. How is this diagnosed? This condition may be diagnosed based on:  Your medical history and a physical exam.  Various tests or procedures, such as: ? Blood tests, stool tests, or breath tests to check for the H. pylori bacteria. ? An X-ray exam (upper gastrointestinal series) of the esophagus, stomach, and small intestine. ? Upper endoscopy. The health care provider examines the esophagus, stomach, and small intestine using a small flexible tube that has a video camera at the end. ? Biopsy. A tissue sample is removed to be examined under a microscope. How is this treated? Treatment for this condition may include:  Eliminating the cause of the ulcer, such as smoking or use of NSAIDs, and limiting alcohol and caffeine intake.  Medicines to reduce the amount of acid in your digestive tract.  Antibiotic medicines, if the ulcer is caused by an H. pylori infection.  An upper endoscopy may be used to treat a bleeding ulcer.  Surgery. This may be needed if the bleeding is severe or if the ulcer created a hole somewhere in the digestive system. Follow these instructions at  home:  Do not drink alcohol if your health care provider tells you not to drink.  Do not use any products that contain nicotine or tobacco, such as cigarettes, e-cigarettes, and chewing tobacco. If you need help quitting, ask your health care provider.  Take over-the-counter and prescription medicines only as told by your health care provider. ? Do not use over-the-counter medicines in place of prescription medicines unless your health care provider approves. ? Do not take aspirin, ibuprofen, or other NSAIDs unless your health care provider told you to do so.  Take over-the-counter and prescription medicines only as told by your health care provider.  Keep all follow-up visits as told by your health care  provider. This is important. Contact a health care provider if:  Your symptoms do not improve within 7 days of starting treatment.  You have ongoing indigestion or heartburn. Get help right away if:  You have sudden, sharp, or persistent pain in your abdomen.  You have bloody or dark black, tarry stools.  You vomit blood or material that looks like coffee grounds.  You become light-headed or you feel faint.  You become weak.  You become sweaty or clammy. Summary  A peptic ulcer is a sore in the lining of the stomach (gastric ulcer) or the first part of the small intestine (duodenal ulcer). The ulcer causes a gradual wearing away (erosion) of the deeper tissue.  Do not use any products that contain nicotine or tobacco, such as cigarettes, e-cigarettes, and chewing tobacco. If you need help quitting, ask your health care provider.  Take over-the-counter and prescription medicines only as told by your health care provider. Do not use over-the-counter medicines in place of prescription medicines unless your health care provider approves.  Contact your health care provider if you have ongoing indigestion or heartburn.  Keep all follow-up visits as told by your health care  provider. This is important. This information is not intended to replace advice given to you by your health care provider. Make sure you discuss any questions you have with your health care provider. Document Revised: 08/09/2017 Document Reviewed: 08/09/2017 Elsevier Patient Education  [place] [employer].    Muscle Cramps and Spasms Muscle cramps and spasms occur when a muscle or muscles tighten and you have no control over this tightening (involuntary muscle contraction). They are a common problem and can develop in any muscle. The most common place is in the calf muscles of the leg. Muscle cramps and muscle spasms are both involuntary muscle contractions, but there are some differences between the two:  Muscle cramps are painful. They come and go and may last for a few seconds or up to 15 minutes. Muscle cramps are often more forceful and last longer than muscle spasms.  Muscle spasms may or may not be painful. They may also last just a few seconds or much longer. Certain medical conditions, such as diabetes or Parkinson's disease, can make it more likely to develop cramps or spasms. However, cramps or spasms are usually not caused by a serious underlying problem. Common causes include:  Doing more physical work or exercise than your body is ready for (overexertion).  Overuse from repeating certain movements too many times.  Remaining in a certain position for a long period of time.  Improper preparation, form, or technique while playing a sport or doing an activity.  Dehydration.  Injury.  Side effects of some medicines.  Abnormally low levels of the salts and minerals in your blood (electrolytes), especially potassium and calcium. This could happen if you are taking water pills (diuretics) or if you are pregnant. In many cases, the cause of muscle cramps or spasms is not known. Follow these instructions at home: Managing pain and stiffness      Try massaging, stretching, and  relaxing the affected muscle. Do this for several minutes at a time.  If directed, apply heat to tight or tense muscles as often as told by your health care provider. Use the heat source that your health care provider recommends, such as a moist heat pack or a heating pad. ? Place a towel between your skin and the heat source. ? Leave the heat on for 20-30  minutes. ? Remove the heat if your skin turns bright red. This is especially important if you are unable to feel pain, heat, or cold. You may have a greater risk of getting burned.  If directed, put ice on the affected area. This may help if you are sore or have pain after a cramp or spasm. ? Put ice in a plastic bag. ? Place a towel between your skin and the bag. ? Leavethe ice on for 20 minutes, 2-3 times a day.  Try taking hot showers or baths to help relax tight muscles. Eating and drinking  Drink enough fluid to keep your urine pale yellow. Staying well hydrated may help prevent cramps or spasms.  Eat a healthy diet that includes plenty of nutrients to help your muscles function. A healthy diet includes fruits and vegetables, lean protein, whole grains, and low-fat or nonfat dairy products. General instructions  If you are having frequent cramps, avoid intense exercise for several days.  Take over-the-counter and prescription medicines only as told by your health care provider.  Pay attention to any changes in your symptoms.  Keep all follow-up visits as told by your health care provider. This is important. Contact a health care provider if:  Your cramps or spasms get more severe or happen more often.  Your cramps or spasms do not improve over time. Summary  Muscle cramps and spasms occur when a muscle or muscles tighten and you have no control over this tightening (involuntary muscle contraction).  The most common place for cramps or spasms to occur is in the calf muscles of the leg.  Massaging, stretching, and relaxing  the affected muscle may relieve the cramp or spasm.  Drink enough fluid to keep your urine pale yellow. Staying well hydrated may help prevent cramps or spasms. This information is not intended to replace advice given to you by your health care provider. Make sure you discuss any questions you have with your health care provider. Document Revised: 06/27/2017 Document Reviewed: 06/27/2017 Elsevier Patient Education  2020 [employer]   Fatty Liver Disease  Fatty liver disease occurs when too much fat has built up in your liver cells. Fatty liver disease is also called hepatic steatosis or steatohepatitis. The liver removes harmful substances from your bloodstream and produces fluids that your body needs. It also helps your body use and store energy from the food you eat. In many cases, fatty liver disease does not cause symptoms or problems. It is often diagnosed when tests are being done for other reasons. However, over time, fatty liver can cause inflammation that may lead to more serious liver problems, such as scarring of the liver (cirrhosis) and liver failure. Fatty liver is associated with insulin resistance, increased body fat, high blood pressure (hypertension), and high cholesterol. These are features of metabolic syndrome and increase your risk for stroke, diabetes, and heart disease. What are the causes? This condition may be caused by:  Drinking too much alcohol.  Poor nutrition.  Obesity.  Cushing's syndrome.  Diabetes.  High cholesterol.  Certain drugs.  Poisons.  Some viral infections.  Pregnancy. What increases the risk? You are more likely to develop this condition if you:  Abuse alcohol.  Are overweight.  Have diabetes.  Have hepatitis.  Have a high triglyceride level.  Are pregnant. What are the signs or symptoms? Fatty liver disease often does not cause symptoms. If symptoms do develop, they can include:  Fatigue.  Weakness.  Weight  loss.  Confusion.  Abdominal pain.  Nausea and vomiting.  Yellowing of your skin and the white parts of your eyes (jaundice).  Itchy skin. How is this diagnosed? This condition may be diagnosed by:  A physical exam and medical history.  Blood tests.  Imaging tests, such as an ultrasound, CT scan, or MRI.  A liver biopsy. A small sample of liver tissue is removed using a needle. The sample is then looked at under a microscope. How is this treated? Fatty liver disease is often caused by other health conditions. Treatment for fatty liver may involve medicines and lifestyle changes to manage conditions such as:  Alcoholism.  High cholesterol.  Diabetes.  Being overweight or obese. Follow these instructions at home:   Do not drink alcohol. If you have trouble quitting, ask your health care provider how to safely quit with the help of medicine or a supervised program. This is important to keep your condition from getting worse.  Eat a healthy diet as told by your health care provider. Ask your health care provider about working with a diet and nutrition specialist (dietitian) to develop an eating plan.  Exercise regularly. This can help you lose weight and control your cholesterol and diabetes. Talk to your health care provider about an exercise plan and which activities are best for you.  Take over-the-counter and prescription medicines only as told by your health care provider.  Keep all follow-up visits as told by your health care provider. This is important. Contact a health care provider if: You have trouble controlling your:  Blood sugar. This is especially important if you have diabetes.  Cholesterol.  Drinking of alcohol. Get help right away if:  You have abdominal pain.  You have jaundice.  You have nausea and vomiting.  You vomit blood or material that looks like coffee grounds.  You have stools that are black, tar-like, or bloody. Summary  Fatty  liver disease develops when too much fat builds up in the cells of your liver.  Fatty liver disease often causes no symptoms or problems. However, over time, fatty liver can cause inflammation that may lead to more serious liver problems, such as scarring of the liver (cirrhosis).  You are more likely to develop this condition if you abuse alcohol, are pregnant, are overweight, have diabetes, have hepatitis, or have high triglyceride levels.  Contact your health care provider if you have trouble controlling your weight, blood sugar, cholesterol, or drinking of alcohol. This information is not intended to replace advice given to you by your health care provider. Make sure you discuss any questions you have with your health care provider. Document Revised: 01/14/2017 Document Reviewed: 11/10/2016   Nonalcoholic Fatty Liver Disease Diet, Adult Nonalcoholic fatty liver disease is a condition that causes fat to build up in and around the liver. The disease makes it harder for the liver to work the way that it should. Following a healthy diet can help to keep nonalcoholic fatty liver disease under control. It can also help to prevent or improve conditions that are associated with the disease, such as heart disease, diabetes, high blood pressure, and abnormal cholesterol levels. Along with regular exercise, this diet:  Promotes weight loss.  Helps to control blood sugar levels.  Helps to improve the way that the body uses insulin. What are tips for following this plan? Reading food labels Always check food labels for:  The amount of saturated fat in a food. You should limit your intake of saturated fat. Saturated fat  is found in foods that come from animals, including meat and dairy products such as butter, cheese, and whole milk.  The amount of fiber in a food. You should choose high-fiber foods such as fruits, vegetables, and whole grains. Try to get 25-30 grams (g) of fiber a  day.  Cooking  When cooking, use heart-healthy oils that are high in monounsaturated fats. These include olive oil, canola oil, and avocado oil.  Limit frying or deep-frying foods. Cook foods using healthy methods such as baking, boiling, steaming, and grilling instead. Meal planning  You may want to keep track of how many calories you take in. Eating the right amount of calories will help you achieve a healthy weight. Meeting with a registered dietitian can help you get started.  Limit how often you eat takeout and fast food. These foods are usually very high in fat, salt, and sugar.  Use the glycemic index (GI) to plan your meals. The index tells you how quickly a food will raise your blood sugar. Choose low-GI foods (GI less than 55). These foods take a longer time to raise blood sugar. A registered dietitian can help you identify foods lower on the GI scale. Lifestyle  You may want to follow a Mediterranean diet. This diet includes a lot of vegetables, lean meats or fish, whole grains, fruits, and healthy oils and fats. What foods can I eat?  Fruits Bananas. Apples. Oranges. Grapes. Papaya. Mango. Pomegranate. Kiwi. Grapefruit. Cherries. Vegetables Lettuce. Spinach. Peas. Beets. Cauliflower. Cabbage. Broccoli. Carrots. Tomatoes. Squash. Eggplant. Herbs. Peppers. Onions. Cucumbers. Brussels sprouts. Yams and sweet potatoes. Beans. Lentils. Grains Whole wheat or whole-grain foods, including breads, crackers, cereals, and pasta. Stone-ground whole wheat. Unsweetened oatmeal. Bulgur. Barley. Quinoa. Brown or wild rice. Corn or whole wheat flour tortillas. Meats and other proteins Lean meats. Poultry. Tofu. Seafood and shellfish. Dairy Low-fat or fat-free dairy products, such as yogurt, cottage cheese, or cheese. Beverages Water. Sugar-free drinks. Tea. Coffee. Low-fat or skim milk. Milk alternatives, such as soy or almond milk. Real fruit juice. Fats and oils Avocado. Canola or olive  oil. Nuts and nut butters. Seeds. Seasonings and condiments Mustard. Relish. Low-fat, low-sugar ketchup and barbecue sauce. Low-fat or fat-free mayonnaise. Sweets and desserts Sugar-free sweets. The items listed above may not be a complete list of foods and beverages you can eat. Contact a dietitian for more information. What foods should I limit or avoid? Meats and other proteins Limit red meat to 1-2 times a week. Dairy MicrosoftFull-fat dairy. Fats and oils Palm oil and coconut oil. Fried foods. Other foods Processed foods. Foods that contain a lot of salt or sodium. Sweets and desserts Sweets that contain sugar. Beverages Sweetened drinks, such as sweet tea, milkshakes, iced sweet drinks, and sodas. Alcohol. The items listed above may not be a complete list of foods and beverages you should avoid. Contact a dietitian for more information. Where to find more information The [employer] of Diabetes and Digestive and Kidney Diseases: StageSync.siniddk.nih.gov Summary  Nonalcoholic fatty liver disease is a condition that causes fat to build up in and around the liver.  Following a healthy diet can help to keep nonalcoholic fatty liver disease under control. Your diet should be rich in fruits, vegetables, whole grains, and lean proteins.  Limit your intake of saturated fat. Saturated fat is found in foods that come from animals, including meat and dairy products such as butter, cheese, and whole milk.  This diet promotes weight loss, helps to control blood  sugar levels, and helps to improve the way that the body uses insulin. This information is not intended to replace advice given to you by your health care provider. Make sure you discuss any questions you have with your health care provider. Document Revised: 05/26/2018 Document Reviewed: 02/23/2018 Elsevier Patient Education  2020 [employer].  Elsevier Patient Education  [employer].
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
## 2019-06-21 ENCOUNTER — Other Ambulatory Visit: Payer: Medicare Other

## 2019-06-21 ENCOUNTER — Ambulatory Visit: Payer: Medicare Other

## 2019-09-21 ENCOUNTER — Encounter: Payer: Self-pay | Admitting: Internal Medicine

## 2019-09-21 ENCOUNTER — Other Ambulatory Visit: Payer: Self-pay | Admitting: Internal Medicine

## 2019-09-21 DIAGNOSIS — U071 COVID-19: Secondary | ICD-10-CM | POA: Insufficient documentation

## 2019-09-21 DIAGNOSIS — I10 Essential (primary) hypertension: Secondary | ICD-10-CM

## 2019-09-21 MED ORDER — LISINOPRIL 40 MG PO TABS: 40.0000 mg | ORAL_TABLET | Freq: Every day | ORAL | 3 refills | Status: DC

## 2019-10-02 ENCOUNTER — Other Ambulatory Visit: Payer: Self-pay | Admitting: Internal Medicine

## 2019-10-02 DIAGNOSIS — I10 Essential (primary) hypertension: Secondary | ICD-10-CM

## 2019-10-02 MED ORDER — LISINOPRIL 40 MG PO TABS: 40.0000 mg | ORAL_TABLET | Freq: Every day | ORAL | 3 refills | Status: DC

## 2019-10-18 ENCOUNTER — Other Ambulatory Visit: Payer: Self-pay

## 2019-10-18 NOTE — Telephone Encounter (Signed)
error 

## 2019-11-06 ENCOUNTER — Ambulatory Visit (INDEPENDENT_AMBULATORY_CARE_PROVIDER_SITE_OTHER): Payer: Medicare Other

## 2019-11-06 ENCOUNTER — Other Ambulatory Visit: Payer: Self-pay

## 2019-11-06 ENCOUNTER — Ambulatory Visit
Admission: EM | Admit: 2019-11-06 | Discharge: 2019-11-06 | Disposition: A | Discharge status: Home / Self Care | Payer: Medicare Other | Attending: Emergency Medicine | Admitting: Emergency Medicine

## 2019-11-06 DIAGNOSIS — N189 Chronic kidney disease, unspecified: Secondary | ICD-10-CM | POA: Diagnosis not present

## 2019-11-06 DIAGNOSIS — Z79899 Other long term (current) drug therapy: Secondary | ICD-10-CM | POA: Diagnosis not present

## 2019-11-06 DIAGNOSIS — Z8616 Personal history of COVID-19: Secondary | ICD-10-CM

## 2019-11-06 DIAGNOSIS — G43909 Migraine, unspecified, not intractable, without status migrainosus: Secondary | ICD-10-CM | POA: Diagnosis not present

## 2019-11-06 DIAGNOSIS — Z7901 Long term (current) use of anticoagulants: Secondary | ICD-10-CM | POA: Diagnosis not present

## 2019-11-06 DIAGNOSIS — R509 Fever, unspecified: Secondary | ICD-10-CM | POA: Insufficient documentation

## 2019-11-06 DIAGNOSIS — R197 Diarrhea, unspecified: Secondary | ICD-10-CM | POA: Insufficient documentation

## 2019-11-06 DIAGNOSIS — I129 Hypertensive chronic kidney disease with stage 1 through stage 4 chronic kidney disease, or unspecified chronic kidney disease: Secondary | ICD-10-CM | POA: Insufficient documentation

## 2019-11-06 DIAGNOSIS — S9031XA Contusion of right foot, initial encounter: Secondary | ICD-10-CM

## 2019-11-06 DIAGNOSIS — S99921A Unspecified injury of right foot, initial encounter: Secondary | ICD-10-CM

## 2019-11-06 DIAGNOSIS — R05 Cough: Secondary | ICD-10-CM | POA: Insufficient documentation

## 2019-11-06 DIAGNOSIS — E1122 Type 2 diabetes mellitus with diabetic chronic kidney disease: Secondary | ICD-10-CM | POA: Insufficient documentation

## 2019-11-06 DIAGNOSIS — F909 Attention-deficit hyperactivity disorder, unspecified type: Secondary | ICD-10-CM | POA: Diagnosis not present

## 2019-11-06 DIAGNOSIS — Z87820 Personal history of traumatic brain injury: Secondary | ICD-10-CM | POA: Diagnosis not present

## 2019-11-06 DIAGNOSIS — G8929 Other chronic pain: Secondary | ICD-10-CM | POA: Diagnosis not present

## 2019-11-06 DIAGNOSIS — J45909 Unspecified asthma, uncomplicated: Secondary | ICD-10-CM | POA: Diagnosis not present

## 2019-11-06 DIAGNOSIS — F4310 Post-traumatic stress disorder, unspecified: Secondary | ICD-10-CM | POA: Insufficient documentation

## 2019-11-06 DIAGNOSIS — J069 Acute upper respiratory infection, unspecified: Secondary | ICD-10-CM | POA: Diagnosis not present

## 2019-11-06 DIAGNOSIS — R0602 Shortness of breath: Secondary | ICD-10-CM | POA: Diagnosis not present

## 2019-11-06 DIAGNOSIS — M79674 Pain in right toe(s): Secondary | ICD-10-CM | POA: Diagnosis not present

## 2019-11-06 DIAGNOSIS — Z20822 Contact with and (suspected) exposure to covid-19: Secondary | ICD-10-CM | POA: Insufficient documentation

## 2019-11-06 DIAGNOSIS — K449 Diaphragmatic hernia without obstruction or gangrene: Secondary | ICD-10-CM | POA: Diagnosis not present

## 2019-11-06 DIAGNOSIS — W208XXA Other cause of strike by thrown, projected or falling object, initial encounter: Secondary | ICD-10-CM | POA: Diagnosis not present

## 2019-11-06 DIAGNOSIS — R438 Other disturbances of smell and taste: Secondary | ICD-10-CM | POA: Insufficient documentation

## 2019-11-06 DIAGNOSIS — K760 Fatty (change of) liver, not elsewhere classified: Secondary | ICD-10-CM | POA: Insufficient documentation

## 2019-11-06 LAB — SARS CORONAVIRUS 2 (TAT 6-24 HRS): SARS Coronavirus 2: NEGATIVE
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
## 2019-11-06 NOTE — ED Triage Notes (Signed)
Patient states that he started having covid symptoms around 3 days ago with 102 fever, cough, shortness of breath. States that he had covid in May. States that he took a self test from [facility] today and it was positive for covid.   States that he has pain in right foot that occurred yesterday. Patient states that he was cutting down a tree and it landed on his foot. States that pain is worse without compression.

## 2019-11-06 NOTE — Discharge Instructions (Addendum)
Keep your right foot elevated as much as possible and apply ice.  Use Tylenol and Ibuprofen as needed for pain and fever.  If your shortness of breath worsens, if you have shortness of breath at rest go to the ER.  We can refer you to the infusion clinic if your COVID swab we collected comes back positive.  Also, your blood pressure was elevated in clinic. Remember to take your blood pressure medications as prescribed.

## 2019-11-15 ENCOUNTER — Telehealth: Payer: Self-pay | Admitting: Internal Medicine

## 2019-11-15 NOTE — Telephone Encounter (Signed)
Pt would like a refill on ibuprofen 800mg ? Pt states that he has degenerative disc disease

## 2019-11-15 NOTE — Telephone Encounter (Signed)
Please advise med not currently on Patient's list

## 2019-11-19 NOTE — Telephone Encounter (Signed)
I called and spoke with the patient and he stated the tylenol is not helping his pain, He does want the referral to Ortho but he also wants to know if you could prescribe something for pain if not the Ibuprofen.  Laderrick Wilk,cma

## 2019-11-19 NOTE — Telephone Encounter (Signed)
Ibuprofen is not good for his kidneys nor his blood pressure it will elevate it   I prefer he use Tylenol instead Tylenol arthritis otc, lidocaine or salonpas pain patches or topical voltaren gel instead    Does he want to see ortho for back pain for further recs?

## 2019-11-28 ENCOUNTER — Telehealth: Payer: Self-pay | Admitting: Internal Medicine

## 2019-11-28 NOTE — Telephone Encounter (Signed)
Left message for patient to call back and schedule Medicare Annual Wellness Visit (AWV)  ° °This should be a telephone visit only=30 minutes. ° °No hx of AWV; please schedule at anytime with Denisa O'Brien-Blaney at [place] ° ° °

## 2019-12-03 DIAGNOSIS — M19041 Primary osteoarthritis, right hand: Secondary | ICD-10-CM | POA: Insufficient documentation

## 2019-12-03 NOTE — Telephone Encounter (Signed)
There is nothing else to be Rx for pain [facility] referral placed

## 2019-12-03 NOTE — Addendum Note (Signed)
Addended by: Quentin Ore on: 12/03/2019 09:27 AM   Modules accepted: Orders

## 2019-12-10 ENCOUNTER — Telehealth: Payer: Self-pay | Admitting: Internal Medicine

## 2019-12-10 NOTE — Telephone Encounter (Signed)
Rejection Reason - Patient did not respond - We have contacted this patient 2 times, left 2 messages and mailed a letter. This patient has not contacted [place] back to schedule. Referral is being closed due to time sensitivity but pt has our info to call and schedule. We will still be happy to assist your office and the patient. Thank you!" [facility] [place] said on Dec 10, 2019 8:01 AM

## 2019-12-10 NOTE — Telephone Encounter (Signed)
Advise pt he can call [facility] and schedule his own appt they tried to reach him w/o success for referral

## 2019-12-11 NOTE — Telephone Encounter (Signed)
Letter sent.

## 2020-01-31 ENCOUNTER — Telehealth: Payer: Self-pay

## 2020-01-31 NOTE — Telephone Encounter (Signed)
Patient calling back in and states he does not want to deal with the urgent care he went to every again. Informed the Patient that we can not see their results and can not send in a medication with out a test, results, and a diagnosis. Informed the Patient that our next available appointment is Tuesday. He would have to be tested again and then treated.   Informed the Patient that he should only have to call the urgent care and request a medication since they tested him. Patient declines to speak with urgent care and declines Tuesday appointment. He states he will go to the [place] tomorrow to be retested and treated.

## 2020-01-31 NOTE — Telephone Encounter (Signed)
Pt came in and said he tested positive for chlamydia but they didn't give him any meds at the urgent care. . No appts avail

## 2020-01-31 NOTE — Telephone Encounter (Signed)
Left message to return call. Needing to know what urgent care Patient went to.  Unable to see the results in his chart. He will need to contact their office and request a medication be sent in

## 2020-04-07 ENCOUNTER — Telehealth: Payer: Self-pay | Admitting: Internal Medicine

## 2020-04-07 NOTE — Telephone Encounter (Signed)
Left message for patient to call back and schedule Medicare Annual Wellness Visit (AWV)  ° °This should be a telephone visit only=30 minutes. ° °No hx of AWV; please schedule at anytime with Denisa O'Brien-Blaney at [place] ° ° °

## 2020-05-09 ENCOUNTER — Other Ambulatory Visit: Payer: Self-pay

## 2020-05-09 ENCOUNTER — Emergency Department
Admission: EM | Admit: 2020-05-09 | Discharge: 2020-05-09 | Disposition: A | Discharge status: Home / Self Care | Payer: Medicare Other | Attending: Emergency Medicine | Admitting: Emergency Medicine

## 2020-05-09 DIAGNOSIS — I129 Hypertensive chronic kidney disease with stage 1 through stage 4 chronic kidney disease, or unspecified chronic kidney disease: Secondary | ICD-10-CM | POA: Insufficient documentation

## 2020-05-09 DIAGNOSIS — Z8616 Personal history of COVID-19: Secondary | ICD-10-CM | POA: Insufficient documentation

## 2020-05-09 DIAGNOSIS — J020 Streptococcal pharyngitis: Secondary | ICD-10-CM

## 2020-05-09 DIAGNOSIS — J45909 Unspecified asthma, uncomplicated: Secondary | ICD-10-CM | POA: Insufficient documentation

## 2020-05-09 DIAGNOSIS — N189 Chronic kidney disease, unspecified: Secondary | ICD-10-CM | POA: Insufficient documentation

## 2020-05-09 DIAGNOSIS — J029 Acute pharyngitis, unspecified: Secondary | ICD-10-CM | POA: Diagnosis not present

## 2020-05-09 DIAGNOSIS — Z79899 Other long term (current) drug therapy: Secondary | ICD-10-CM | POA: Diagnosis not present

## 2020-05-09 DIAGNOSIS — E1122 Type 2 diabetes mellitus with diabetic chronic kidney disease: Secondary | ICD-10-CM | POA: Diagnosis not present

## 2020-05-09 DIAGNOSIS — Z20822 Contact with and (suspected) exposure to covid-19: Secondary | ICD-10-CM | POA: Insufficient documentation

## 2020-05-09 LAB — RESP PANEL BY RT-PCR (FLU A&B, COVID) ARPGX2
Influenza A by PCR: NEGATIVE
Influenza B by PCR: NEGATIVE
SARS Coronavirus 2 by RT PCR: NEGATIVE

## 2020-05-09 LAB — GROUP A STREP BY PCR: Group A Strep by PCR: DETECTED — AB

## 2020-05-09 MED ORDER — ACETAMINOPHEN 500 MG PO TABS
1000.0000 mg | ORAL_TABLET | Freq: Once | ORAL | Status: AC
  Administered 2020-05-09: 1000 mg via ORAL
  Filled 2020-05-09: qty 2

## 2020-05-09 MED ORDER — LIDOCAINE VISCOUS HCL 2 % MT SOLN: 15.0000 mL | OROMUCOSAL | 0 refills | Status: AC | PRN

## 2020-05-09 MED ORDER — PENICILLIN V POTASSIUM 250 MG PO TABS
500.0000 mg | ORAL_TABLET | Freq: Once | ORAL | Status: AC
  Administered 2020-05-09: 500 mg via ORAL
  Filled 2020-05-09: qty 2

## 2020-05-09 MED ORDER — OXYCODONE HCL 5 MG PO TABS
5.0000 mg | ORAL_TABLET | Freq: Once | ORAL | Status: AC
  Administered 2020-05-09: 5 mg via ORAL
  Filled 2020-05-09: qty 1

## 2020-05-09 MED ORDER — LIDOCAINE VISCOUS HCL 2 % MT SOLN
15.0000 mL | Freq: Once | OROMUCOSAL | Status: AC
  Administered 2020-05-09: 15 mL via OROMUCOSAL
  Filled 2020-05-09: qty 15

## 2020-05-09 MED ORDER — PENICILLIN V POTASSIUM 500 MG PO TABS: 500.0000 mg | ORAL_TABLET | Freq: Two times a day (BID) | ORAL | 0 refills | Status: AC

## 2020-05-09 MED ORDER — DEXAMETHASONE SODIUM PHOSPHATE 10 MG/ML IJ SOLN
10.0000 mg | Freq: Once | INTRAMUSCULAR | Status: AC
  Administered 2020-05-09: 10 mg via INTRAVENOUS
  Filled 2020-05-09: qty 1

## 2020-05-09 MED ORDER — HYDROCODONE-ACETAMINOPHEN 5-325 MG PO TABS: 1.0000 | ORAL_TABLET | ORAL | 0 refills | Status: AC | PRN

## 2020-05-09 MED ORDER — PREDNISONE 20 MG PO TABS: 60.0000 mg | ORAL_TABLET | Freq: Every day | ORAL | 0 refills | Status: AC
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
## 2020-05-09 NOTE — ED Triage Notes (Signed)
Pt states 4 days ago was using garden fertilizer and got it on his face. States 2 days ago started having sore throat and fever.

## 2020-09-11 ENCOUNTER — Other Ambulatory Visit: Payer: Self-pay | Admitting: Internal Medicine

## 2020-09-11 DIAGNOSIS — I10 Essential (primary) hypertension: Secondary | ICD-10-CM

## 2020-09-11 MED ORDER — LISINOPRIL 40 MG PO TABS: 40.0000 mg | ORAL_TABLET | Freq: Every day | ORAL | 0 refills | Status: DC

## 2020-09-16 ENCOUNTER — Other Ambulatory Visit: Payer: Self-pay | Admitting: Internal Medicine

## 2020-09-16 DIAGNOSIS — I10 Essential (primary) hypertension: Secondary | ICD-10-CM

## 2020-09-16 MED ORDER — LISINOPRIL 40 MG PO TABS: 40.0000 mg | ORAL_TABLET | Freq: Every day | ORAL | 0 refills | Status: AC
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
